# Patient Record
Sex: Female | Born: 1955 | Race: White | Hispanic: No | Marital: Married | Smoking: Never smoker
Health system: Southern US, Community
[De-identification: ages and names within clinical notes are randomized; demographics above are authoritative.]

## PROBLEM LIST (undated history)

## (undated) DIAGNOSIS — M1990 Unspecified osteoarthritis, unspecified site: Secondary | ICD-10-CM

## (undated) DIAGNOSIS — T7840XA Allergy, unspecified, initial encounter: Secondary | ICD-10-CM

## (undated) DIAGNOSIS — J309 Allergic rhinitis, unspecified: Secondary | ICD-10-CM

## (undated) DIAGNOSIS — H269 Unspecified cataract: Secondary | ICD-10-CM

## (undated) DIAGNOSIS — M8580 Other specified disorders of bone density and structure, unspecified site: Secondary | ICD-10-CM

## (undated) HISTORY — DX: Unspecified osteoarthritis, unspecified site: M19.90

## (undated) HISTORY — DX: Allergic rhinitis, unspecified: J30.9

## (undated) HISTORY — DX: Allergy, unspecified, initial encounter: T78.40XA

## (undated) HISTORY — DX: Other specified disorders of bone density and structure, unspecified site: M85.80

## (undated) HISTORY — DX: Unspecified cataract: H26.9

---

## 1979-10-11 HISTORY — PX: OTHER SURGICAL HISTORY: SHX169

## 2008-10-10 LAB — HM COLONOSCOPY: HM Colonoscopy: NORMAL

## 2009-05-29 ENCOUNTER — Ambulatory Visit: Payer: Self-pay | Admitting: Gastroenterology

## 2009-06-12 ENCOUNTER — Ambulatory Visit: Payer: Self-pay | Admitting: Gastroenterology

## 2009-06-12 HISTORY — PX: COLONOSCOPY: SHX174

## 2009-07-10 DIAGNOSIS — M8580 Other specified disorders of bone density and structure, unspecified site: Secondary | ICD-10-CM

## 2009-07-10 HISTORY — DX: Other specified disorders of bone density and structure, unspecified site: M85.80

## 2010-07-23 ENCOUNTER — Emergency Department (HOSPITAL_COMMUNITY): Admission: EM | Admit: 2010-07-23 | Discharge: 2010-07-24 | Payer: Self-pay | Admitting: Emergency Medicine

## 2012-01-16 ENCOUNTER — Other Ambulatory Visit (INDEPENDENT_AMBULATORY_CARE_PROVIDER_SITE_OTHER): Payer: BC Managed Care – PPO

## 2012-01-16 ENCOUNTER — Encounter: Payer: Self-pay | Admitting: Internal Medicine

## 2012-01-16 ENCOUNTER — Ambulatory Visit (INDEPENDENT_AMBULATORY_CARE_PROVIDER_SITE_OTHER): Payer: BC Managed Care – PPO | Admitting: Internal Medicine

## 2012-01-16 VITALS — BP 102/64 | HR 52 | Temp 97.0°F | Ht 67.0 in | Wt 136.6 lb

## 2012-01-16 DIAGNOSIS — Z0000 Encounter for general adult medical examination without abnormal findings: Secondary | ICD-10-CM

## 2012-01-16 DIAGNOSIS — M19042 Primary osteoarthritis, left hand: Secondary | ICD-10-CM | POA: Insufficient documentation

## 2012-01-16 DIAGNOSIS — M19041 Primary osteoarthritis, right hand: Secondary | ICD-10-CM | POA: Insufficient documentation

## 2012-01-16 DIAGNOSIS — Z23 Encounter for immunization: Secondary | ICD-10-CM

## 2012-01-16 DIAGNOSIS — J309 Allergic rhinitis, unspecified: Secondary | ICD-10-CM | POA: Insufficient documentation

## 2012-01-16 DIAGNOSIS — M8580 Other specified disorders of bone density and structure, unspecified site: Secondary | ICD-10-CM | POA: Insufficient documentation

## 2012-01-16 LAB — TSH: TSH: 0.78 u[IU]/mL (ref 0.35–5.50)

## 2012-01-16 LAB — LIPID PANEL
HDL: 82.5 mg/dL (ref >39.00)
Triglycerides: 71 mg/dL (ref 0.0–149.0)
VLDL: 14.2 mg/dL (ref 0.0–40.0)

## 2012-01-16 LAB — BASIC METABOLIC PANEL
Chloride: 101 mEq/L (ref 96–112)
GFR: 88.67 mL/min (ref >60.00)
Potassium: 4.2 mEq/L (ref 3.5–5.1)
Sodium: 138 mEq/L (ref 135–145)

## 2012-01-16 LAB — CBC WITH DIFFERENTIAL/PLATELET
Basophils Relative: 1.2 % (ref 0.0–3.0)
Eosinophils Relative: 1.6 % (ref 0.0–5.0)
HCT: 39.8 % (ref 36.0–46.0)
Hemoglobin: 13.5 g/dL (ref 12.0–15.0)
Lymphs Abs: 1.9 10*3/uL (ref 0.7–4.0)
MCV: 88.6 fl (ref 78.0–100.0)
Monocytes Relative: 8.5 % (ref 3.0–12.0)
Platelets: 258 10*3/uL (ref 150.0–400.0)
RBC: 4.49 Mil/uL (ref 3.87–5.11)
WBC: 5.2 10*3/uL (ref 4.5–10.5)

## 2012-01-16 LAB — HEPATIC FUNCTION PANEL
ALT: 17 U/L (ref 0–35)
Albumin: 4.6 g/dL (ref 3.5–5.2)
Total Protein: 7.9 g/dL (ref 6.0–8.3)

## 2012-01-16 LAB — URINALYSIS
Bilirubin Urine: NEGATIVE
Nitrite: NEGATIVE
Total Protein, Urine: NEGATIVE
Urine Glucose: NEGATIVE
pH: 6.5 (ref 5.0–8.0)

## 2012-01-16 LAB — LDL CHOLESTEROL, DIRECT: Direct LDL: 156.4 mg/dL

## 2012-01-16 NOTE — Patient Instructions (Signed)
It was good to see you today. We have reviewed your prior records including labs and tests today Medications reviewed, no changes at this time.  Health Maintenance reviewed - Tdap booster done today. All other immunizations and age based screening are up-to-date. Test(s) ordered today. Your results will be called to you and mailed to you after review (48-72hours after test completion). If any changes need to be made, you will be notified at that time. Please schedule followup annually for physical and labs, call sooner if problems.

## 2012-01-16 NOTE — Progress Notes (Signed)
  Subjective:    Patient ID: Brianna Gardner, female    DOB: 1955-10-23, 55 y.o.   MRN: [id number]  HPI  New patient to me and our division, here today to establish care also patient is here today for annual physical. Patient feels well and has no complaints.   Past Medical History  Diagnosis Date  . Osteoarthritis     hands, great toes B - follows with rheum  . Allergic rhinitis, cause unspecified   . Osteopenia 07/2009    DEXA 08/06/09: -1.9 fem   Family History  Problem Relation Age of Onset  . Arthritis Mother   . Arthritis Father   . Stroke Father   . Stroke Maternal Grandfather   . Stroke Paternal Grandfather    History  Substance Use Topics  . Smoking status: Never Smoker   . Smokeless tobacco: Not on file  . Alcohol Use: Yes    Review of Systems Constitutional: Negative for fever or weight change.  Respiratory: Negative for cough and shortness of breath.   Cardiovascular: Negative for chest pain or palpitations.  Gastrointestinal: Negative for abdominal pain, no bowel changes.  Musculoskeletal: Negative for gait problem or joint swelling.  Skin: Negative for rash.  Neurological: Negative for dizziness or headache.  No other specific complaints in a complete review of systems (except as listed in HPI above).     Objective:   Physical Exam BP 102/64  Pulse 52  Temp(Src) 97 F (36.1 C) (Oral)  Ht 5\' 7"  (1.702 m)  Wt 136 lb 9.6 oz (61.961 kg)  BMI 21.39 kg/m2  SpO2 98% Wt Readings from Last 3 Encounters:  01/16/12 136 lb 9.6 oz (61.961 kg)   Constitutional: She appears well-developed and well-nourished. No distress.  HENT: Head: Normocephalic and atraumatic. Ears: B TMs ok, no erythema or effusion; Nose: Nose normal. Mouth/Throat: Oropharynx is clear and moist. No oropharyngeal exudate.  Eyes: Conjunctivae and EOM are normal. Pupils are equal, round, and reactive to light. No scleral icterus.  Neck: Normal range of motion. Neck supple. No JVD present.  No thyromegaly present.  Cardiovascular: Normal rate, regular rhythm and normal heart sounds.  No murmur heard. No BLE edema. Pulmonary/Chest: Effort normal and breath sounds normal. No respiratory distress. She has no wheezes.  Abdominal: Soft. Bowel sounds are normal. She exhibits no distension. There is no tenderness. no masses Musculoskeletal: Normal range of motion, no joint effusions. No gross deformities Neurological: She is alert and oriented to person, place, and time. No cranial nerve deficit. Coordination normal.  Skin: Skin is warm and dry. No rash noted. No erythema.  Psychiatric: She has a normal mood and affect. Her behavior is normal. Judgment and thought content normal.   No results found for this basename: WBC,  HGB,  HCT,  PLT,  GLUCOSE,  CHOL,  TRIG,  HDL,  LDLDIRECT,  LDLCALC,  ALT,  AST,  NA,  K,  CL,  CREATININE,  BUN,  CO2,  TSH,  PSA,  INR,  GLUF,  HGBA1C,  MICROALBUR       Assessment & Plan:  CPX/v70.0 - Patient has been counseled on age-appropriate routine health concerns for screening and prevention. These are reviewed and up-to-date. Immunizations are up-to-date or declined. Labs ordered and will be reviewed.

## 2012-01-17 ENCOUNTER — Telehealth: Payer: Self-pay | Admitting: *Deleted

## 2012-01-17 NOTE — Telephone Encounter (Signed)
Miss and exit out on labs encounter. Called pt LMOM RTC concerning labs.... 01/17/12@4 :00pm/LMB

## 2012-01-17 NOTE — Telephone Encounter (Signed)
Pt return call back gave results & recommendations concerning labs. Also mail copy to pt address...01/17/12@4 :27pm/LMB

## 2012-08-21 LAB — HM PAP SMEAR

## 2013-01-16 ENCOUNTER — Other Ambulatory Visit (INDEPENDENT_AMBULATORY_CARE_PROVIDER_SITE_OTHER): Payer: BC Managed Care – PPO

## 2013-01-16 ENCOUNTER — Telehealth: Payer: Self-pay | Admitting: *Deleted

## 2013-01-16 DIAGNOSIS — Z0000 Encounter for general adult medical examination without abnormal findings: Secondary | ICD-10-CM

## 2013-01-16 LAB — CBC WITH DIFFERENTIAL/PLATELET
Basophils Absolute: 0 10*3/uL (ref 0.0–0.1)
Lymphocytes Relative: 36.8 % (ref 12.0–46.0)
Monocytes Relative: 10.3 % (ref 3.0–12.0)
Platelets: 236 10*3/uL (ref 150.0–400.0)
RDW: 13.3 % (ref 11.5–14.6)

## 2013-01-16 LAB — URINALYSIS, ROUTINE W REFLEX MICROSCOPIC
Leukocytes, UA: NEGATIVE
Specific Gravity, Urine: <=1.005 (ref 1.000–1.030)
Urobilinogen, UA: 0.2 (ref 0.0–1.0)

## 2013-01-16 LAB — BASIC METABOLIC PANEL
BUN: 17 mg/dL (ref 6–23)
Calcium: 8.9 mg/dL (ref 8.4–10.5)
GFR: 85.61 mL/min (ref >60.00)
Glucose, Bld: 89 mg/dL (ref 70–99)
Sodium: 134 mEq/L — ABNORMAL LOW (ref 135–145)

## 2013-01-16 LAB — HEPATIC FUNCTION PANEL
Bilirubin, Direct: 0.1 mg/dL (ref 0.0–0.3)
Total Bilirubin: 0.8 mg/dL (ref 0.3–1.2)
Total Protein: 7.4 g/dL (ref 6.0–8.3)

## 2013-01-16 LAB — LIPID PANEL
HDL: 73.2 mg/dL (ref >39.00)
Triglycerides: 49 mg/dL (ref 0.0–149.0)
VLDL: 9.8 mg/dL (ref 0.0–40.0)

## 2013-01-16 LAB — TSH: TSH: 0.78 u[IU]/mL (ref 0.35–5.50)

## 2013-01-16 NOTE — Telephone Encounter (Signed)
Pt is here for cpx labs. appt schedule for 01/28/13. Entering cpx labs in comp...Raechel Chute

## 2013-01-28 ENCOUNTER — Ambulatory Visit (INDEPENDENT_AMBULATORY_CARE_PROVIDER_SITE_OTHER): Payer: BC Managed Care – PPO | Admitting: Internal Medicine

## 2013-01-28 ENCOUNTER — Encounter: Payer: Self-pay | Admitting: Internal Medicine

## 2013-01-28 VITALS — BP 104/60 | HR 64 | Temp 98.7°F | Ht 67.0 in | Wt 134.1 lb

## 2013-01-28 DIAGNOSIS — Z0000 Encounter for general adult medical examination without abnormal findings: Secondary | ICD-10-CM

## 2013-01-28 NOTE — Patient Instructions (Signed)
It was good to see you today. We have reviewed your prior records including labs and tests today Medications reviewed, no changes at this time.  Health Maintenance reviewed - all immunizations and age based screening are up-to-date. Please schedule followup annually for physical and labs, call sooner if problems.  Health Maintenance, Females A healthy lifestyle and preventative care can promote health and wellness.  Maintain regular health, dental, and eye exams.  Eat a healthy diet. Foods like vegetables, fruits, whole grains, low-fat dairy products, and lean protein foods contain the nutrients you need without too many calories. Decrease your intake of foods high in solid fats, added sugars, and salt. Get information about a proper diet from your caregiver, if necessary.  Regular physical exercise is one of the most important things you can do for your health. Most adults should get at least 150 minutes of moderate-intensity exercise (any activity that increases your heart rate and causes you to sweat) each week. In addition, most adults need muscle-strengthening exercises on 2 or more days a week.   Maintain a healthy weight. The body mass index (BMI) is a screening tool to identify possible weight problems. It provides an estimate of body fat based on height and weight. Your caregiver can help determine your BMI, and can help you achieve or maintain a healthy weight. For adults 20 years and older:  A BMI below 18.5 is considered underweight.  A BMI of 18.5 to 24.9 is normal.  A BMI of 25 to 29.9 is considered overweight.  A BMI of 30 and above is considered obese.  Maintain normal blood lipids and cholesterol by exercising and minimizing your intake of saturated fat. Eat a balanced diet with plenty of fruits and vegetables. Blood tests for lipids and cholesterol should begin at age 66 and be repeated every 5 years. If your lipid or cholesterol levels are high, you are over 50, or you  are a high risk for heart disease, you may need your cholesterol levels checked more frequently.Ongoing high lipid and cholesterol levels should be treated with medicines if diet and exercise are not effective.  If you smoke, find out from your caregiver how to quit. If you do not use tobacco, do not start.  If you are pregnant, do not drink alcohol. If you are breastfeeding, be very cautious about drinking alcohol. If you are not pregnant and choose to drink alcohol, do not exceed 1 drink per day. One drink is considered to be 12 ounces (355 mL) of beer, 5 ounces (148 mL) of wine, or 1.5 ounces (44 mL) of liquor.  Avoid use of street drugs. Do not share needles with anyone. Ask for help if you need support or instructions about stopping the use of drugs.  High blood pressure causes heart disease and increases the risk of stroke. Blood pressure should be checked at least every 1 to 2 years. Ongoing high blood pressure should be treated with medicines, if weight loss and exercise are not effective.  If you are 59 to 58 years old, ask your caregiver if you should take aspirin to prevent strokes.  Diabetes screening involves taking a blood sample to check your fasting blood sugar level. This should be done once every 3 years, after age 69, if you are within normal weight and without risk factors for diabetes. Testing should be considered at a younger age or be carried out more frequently if you are overweight and have at least 1 risk factor for diabetes.  Breast cancer screening is essential preventative care for women. You should practice "breast self-awareness." This means understanding the normal appearance and feel of your breasts and may include breast self-examination. Any changes detected, no matter how small, should be reported to a caregiver. Women in their 8s and 30s should have a clinical breast exam (CBE) by a caregiver as part of a regular health exam every 1 to 3 years. After age 12, women  should have a CBE every year. Starting at age 90, women should consider having a mammogram (breast X-ray) every year. Women who have a family history of breast cancer should talk to their caregiver about genetic screening. Women at a high risk of breast cancer should talk to their caregiver about having an MRI and a mammogram every year.  The Pap test is a screening test for cervical cancer. Women should have a Pap test starting at age 42. Between ages 28 and 31, Pap tests should be repeated every 2 years. Beginning at age 36, you should have a Pap test every 3 years as long as the past 3 Pap tests have been normal. If you had a hysterectomy for a problem that was not cancer or a condition that could lead to cancer, then you no longer need Pap tests. If you are between ages 34 and 72, and you have had normal Pap tests going back 10 years, you no longer need Pap tests. If you have had past treatment for cervical cancer or a condition that could lead to cancer, you need Pap tests and screening for cancer for at least 20 years after your treatment. If Pap tests have been discontinued, risk factors (such as a new sexual partner) need to be reassessed to determine if screening should be resumed. Some women have medical problems that increase the chance of getting cervical cancer. In these cases, your caregiver may recommend more frequent screening and Pap tests.  The human papillomavirus (HPV) test is an additional test that may be used for cervical cancer screening. The HPV test looks for the virus that can cause the cell changes on the cervix. The cells collected during the Pap test can be tested for HPV. The HPV test could be used to screen women aged 23 years and older, and should be used in women of any age who have unclear Pap test results. After the age of 56, women should have HPV testing at the same frequency as a Pap test.  Colorectal cancer can be detected and often prevented. Most routine colorectal  cancer screening begins at the age of 41 and continues through age 11. However, your caregiver may recommend screening at an earlier age if you have risk factors for colon cancer. On a yearly basis, your caregiver may provide home test kits to check for hidden blood in the stool. Use of a small camera at the end of a tube, to directly examine the colon (sigmoidoscopy or colonoscopy), can detect the earliest forms of colorectal cancer. Talk to your caregiver about this at age 68, when routine screening begins. Direct examination of the colon should be repeated every 5 to 10 years through age 33, unless early forms of pre-cancerous polyps or small growths are found.  Hepatitis C blood testing is recommended for all people born from [birth year] through 1966 and any individual with known risks for hepatitis C.  Practice safe sex. Use condoms and avoid high-risk sexual practices to reduce the spread of sexually transmitted infections (STIs). Sexually active women aged  25 and younger should be checked for Chlamydia, which is a common sexually transmitted infection. Older women with new or multiple partners should also be tested for Chlamydia. Testing for other STIs is recommended if you are sexually active and at increased risk.  Osteoporosis is a disease in which the bones lose minerals and strength with aging. This can result in serious bone fractures. The risk of osteoporosis can be identified using a bone density scan. Women ages 45 and over and women at risk for fractures or osteoporosis should discuss screening with their caregivers. Ask your caregiver whether you should be taking a calcium supplement or vitamin D to reduce the rate of osteoporosis.  Menopause can be associated with physical symptoms and risks. Hormone replacement therapy is available to decrease symptoms and risks. You should talk to your caregiver about whether hormone replacement therapy is right for you.  Use sunscreen with a sun protection  factor (SPF) of 30 or greater. Apply sunscreen liberally and repeatedly throughout the day. You should seek shade when your shadow is shorter than you. Protect yourself by wearing long sleeves, pants, a wide-brimmed hat, and sunglasses year round, whenever you are outdoors.  Notify your caregiver of new moles or changes in moles, especially if there is a change in shape or color. Also notify your caregiver if a mole is larger than the size of a pencil eraser.  Stay current with your immunizations. Document Released: 04/11/2011 Document Revised: 12/19/2011 Document Reviewed: 04/11/2011 [facility] Patient Information 2013 [place], [place].

## 2013-01-28 NOTE — Progress Notes (Signed)
Subjective:    Patient ID: Brianna Gardner, female    DOB: Oct 17, 1955, 57 y.o.   MRN: [id number]  HPI  patient is here today for annual physical. Patient feels well overall  Past Medical History  Diagnosis Date  . Osteoarthritis     hands, great toes B - follows with rheum  . Allergic rhinitis, cause unspecified   . Osteopenia 07/2009    DEXA 08/06/09: -1.9 fem   Family History  Problem Relation Age of Onset  . Arthritis Mother   . Arthritis Father   . Stroke Father   . Stroke Maternal Grandfather   . Stroke Paternal Grandfather    History  Substance Use Topics  . Smoking status: Never Smoker   . Smokeless tobacco: Not on file  . Alcohol Use: Yes    Review of Systems  Constitutional: Negative for fever or weight change.  Respiratory: Negative for cough and shortness of breath.   Cardiovascular: Negative for chest pain or palpitations.  Gastrointestinal: Negative for abdominal pain, no bowel changes.  Musculoskeletal: Negative for gait problem or joint swelling.  Skin: Negative for rash.  Neurological: Negative for dizziness or headache.  No other specific complaints in a complete review of systems (except as listed in HPI above).     Objective:   Physical Exam  BP 104/60  Pulse 64  Temp(Src) 98.7 F (37.1 C) (Oral)  Ht 5\' 7"  (1.702 m)  Wt 134 lb 1.9 oz (60.836 kg)  BMI 21 kg/m2  SpO2 98% Wt Readings from Last 3 Encounters:  01/28/13 134 lb 1.9 oz (60.836 kg)  01/16/12 136 lb 9.6 oz (61.961 kg)   Constitutional: She appears well-developed and well-nourished. No distress.  HENT: Head: Normocephalic and atraumatic. Ears: B TMs ok, no erythema or effusion; Nose: Nose normal. Mouth/Throat: Oropharynx is clear and moist. No oropharyngeal exudate.  Eyes: Conjunctivae and EOM are normal. Pupils are equal, round, and reactive to light. No scleral icterus.  Neck: Normal range of motion. Neck supple. No JVD present. No thyromegaly present.  Cardiovascular: Normal  rate, regular rhythm and normal heart sounds.  No murmur heard. No BLE edema. Pulmonary/Chest: Effort normal and breath sounds normal. No respiratory distress. She has no wheezes.  Abdominal: Soft. Bowel sounds are normal. She exhibits no distension. There is no tenderness. no masses Musculoskeletal: Normal range of motion, no joint effusions. No gross deformities Neurological: She is alert and oriented to person, place, and time. No cranial nerve deficit. Coordination normal.  Skin: Skin is warm and dry. No rash noted. No erythema.  Psychiatric: She has a normal mood and affect. Her behavior is normal. Judgment and thought content normal.   Lab Results  Component Value Date   WBC 4.4* 01/16/2013   HGB 12.7 01/16/2013   HCT 36.9 01/16/2013   PLT 236.0 01/16/2013   GLUCOSE 89 01/16/2013   CHOL 242* 01/16/2013   TRIG 49.0 01/16/2013   HDL 73.20 01/16/2013   LDLDIRECT 149.4 01/16/2013   ALT 20 01/16/2013   AST 24 01/16/2013   NA 134* 01/16/2013   K 3.9 01/16/2013   CL 98 01/16/2013   CREATININE 0.7 01/16/2013   BUN 17 01/16/2013   CO2 25 01/16/2013   TSH 0.78 01/16/2013   Ecg: RBBB - sinus @ 62bpm -no prior comparison     Assessment & Plan:  CPX/v70.0 - Patient has been counseled on age-appropriate routine health concerns for screening and prevention. These are reviewed and up-to-date. Immunizations are up-to-date or declined. Labs/ECG ordered  and reviewed.

## 2013-07-22 ENCOUNTER — Encounter: Payer: Self-pay | Admitting: Internal Medicine

## 2013-10-14 LAB — HM MAMMOGRAPHY

## 2013-10-15 ENCOUNTER — Encounter: Payer: Self-pay | Admitting: Internal Medicine

## 2014-03-27 ENCOUNTER — Encounter: Payer: BC Managed Care – PPO | Admitting: Internal Medicine

## 2014-05-26 ENCOUNTER — Encounter: Payer: Self-pay | Admitting: Gastroenterology

## 2014-06-24 ENCOUNTER — Encounter: Payer: Self-pay | Admitting: Internal Medicine

## 2014-06-24 ENCOUNTER — Other Ambulatory Visit (INDEPENDENT_AMBULATORY_CARE_PROVIDER_SITE_OTHER): Payer: BC Managed Care – PPO

## 2014-06-24 ENCOUNTER — Ambulatory Visit (INDEPENDENT_AMBULATORY_CARE_PROVIDER_SITE_OTHER): Payer: BC Managed Care – PPO | Admitting: Internal Medicine

## 2014-06-24 VITALS — BP 108/78 | HR 65 | Temp 97.6°F | Ht 67.0 in | Wt 135.5 lb

## 2014-06-24 DIAGNOSIS — Z0000 Encounter for general adult medical examination without abnormal findings: Secondary | ICD-10-CM

## 2014-06-24 LAB — URINALYSIS, ROUTINE W REFLEX MICROSCOPIC
BILIRUBIN URINE: NEGATIVE
Hgb urine dipstick: NEGATIVE
KETONES UR: NEGATIVE
Leukocytes, UA: NEGATIVE
Nitrite: NEGATIVE
PH: 7.5 (ref 5.0–8.0)
RBC / HPF: NONE SEEN (ref 0–?)
Specific Gravity, Urine: <=1.005 — AB (ref 1.000–1.030)
TOTAL PROTEIN, URINE-UPE24: NEGATIVE
Urine Glucose: NEGATIVE
Urobilinogen, UA: 0.2 (ref 0.0–1.0)
WBC, UA: NONE SEEN (ref 0–?)

## 2014-06-24 LAB — LIPID PANEL
Cholesterol: 243 mg/dL — ABNORMAL HIGH (ref 0–200)
HDL: 79.4 mg/dL (ref >39.00)
LDL Cholesterol: 152 mg/dL — ABNORMAL HIGH (ref 0–99)
NonHDL: 163.6
TRIGLYCERIDES: 59 mg/dL (ref 0.0–149.0)
Total CHOL/HDL Ratio: 3
VLDL: 11.8 mg/dL (ref 0.0–40.0)

## 2014-06-24 LAB — BASIC METABOLIC PANEL
BUN: 14 mg/dL (ref 6–23)
CALCIUM: 9.6 mg/dL (ref 8.4–10.5)
CO2: 29 meq/L (ref 19–32)
Chloride: 101 mEq/L (ref 96–112)
Creatinine, Ser: 0.8 mg/dL (ref 0.4–1.2)
GFR: 76.75 mL/min (ref >60.00)
Glucose, Bld: 89 mg/dL (ref 70–99)
POTASSIUM: 4.4 meq/L (ref 3.5–5.1)
SODIUM: 137 meq/L (ref 135–145)

## 2014-06-24 LAB — TSH: TSH: 1.03 u[IU]/mL (ref 0.35–4.50)

## 2014-06-24 LAB — HEPATIC FUNCTION PANEL
ALT: 20 U/L (ref 0–35)
AST: 24 U/L (ref 0–37)
Albumin: 4.2 g/dL (ref 3.5–5.2)
Alkaline Phosphatase: 73 U/L (ref 39–117)
BILIRUBIN TOTAL: 0.7 mg/dL (ref 0.2–1.2)
Bilirubin, Direct: 0.1 mg/dL (ref 0.0–0.3)
Total Protein: 7.8 g/dL (ref 6.0–8.3)

## 2014-06-24 NOTE — Progress Notes (Signed)
Pre visit review using our clinic review tool, if applicable. No additional management support is needed unless otherwise documented below in the visit note. 

## 2014-06-24 NOTE — Patient Instructions (Addendum)
It was good to see you today.  We have reviewed your prior records including labs and tests today  Health Maintenance reviewed - get your flu shot in next 6-8 weeks -all other recommended immunizations and age-appropriate screenings are up-to-date.  Test(s) ordered today. Your results will be released to [facility] (or called to you) after review, usually within 72hours after test completion. If any changes need to be made, you will be notified at that same time.  Medications reviewed and updated, no changes recommended at this time.  Please schedule followup in 12-18 months for annual exam and labs, call sooner if problems.  Health Maintenance Adopting a healthy lifestyle and getting preventive care can go a long way to promote health and wellness. Talk with your health care provider about what schedule of regular examinations is right for you. This is a good chance for you to check in with your provider about disease prevention and staying healthy. In between checkups, there are plenty of things you can do on your own. Experts have done a lot of research about which lifestyle changes and preventive measures are most likely to keep you healthy. Ask your health care provider for more information. WEIGHT AND DIET  Eat a healthy diet  Be sure to include plenty of vegetables, fruits, low-fat dairy products, and lean protein.  Do not eat a lot of foods high in solid fats, added sugars, or salt.  Get regular exercise. This is one of the most important things you can do for your health.  Most adults should exercise for at least 150 minutes each week. The exercise should increase your heart rate and make you sweat (moderate-intensity exercise).  Most adults should also do strengthening exercises at least twice a week. This is in addition to the moderate-intensity exercise.  Maintain a healthy weight  Body mass index (BMI) is a measurement that can be used to identify possible weight problems. It  estimates body fat based on height and weight. Your health care provider can help determine your BMI and help you achieve or maintain a healthy weight.  For females 59 years of age and older:   A BMI below 18.5 is considered underweight.  A BMI of 18.5 to 24.9 is normal.  A BMI of 25 to 29.9 is considered overweight.  A BMI of 30 and above is considered obese.  Watch levels of cholesterol and blood lipids  You should start having your blood tested for lipids and cholesterol at 59 years of age, then have this test every 5 years.  You may need to have your cholesterol levels checked more often if:  Your lipid or cholesterol levels are high.  You are older than 59 years of age.  You are at high risk for heart disease.  CANCER SCREENING   Lung Cancer  Lung cancer screening is recommended for adults 80-47 years old who are at high risk for lung cancer because of a history of smoking.  A yearly low-dose CT scan of the lungs is recommended for people who:  Currently smoke.  Have quit within the past 15 years.  Have at least a 30-pack-year history of smoking. A pack year is smoking an average of one pack of cigarettes a day for 1 year.  Yearly screening should continue until it has been 15 years since you quit.  Yearly screening should stop if you develop a health problem that would prevent you from having lung cancer treatment.  Breast Cancer  Practice breast self-awareness.  This means understanding how your breasts normally appear and feel.  It also means doing regular breast self-exams. Let your health care provider know about any changes, no matter how small.  If you are in your 20s or 30s, you should have a clinical breast exam (CBE) by a health care provider every 1-3 years as part of a regular health exam.  If you are 79 or older, have a CBE every year. Also consider having a breast X-ray (mammogram) every year.  If you have a family history of breast cancer, talk  to your health care provider about genetic screening.  If you are at high risk for breast cancer, talk to your health care provider about having an MRI and a mammogram every year.  Breast cancer gene (BRCA) assessment is recommended for women who have family members with BRCA-related cancers. BRCA-related cancers include:  Breast.  Ovarian.  Tubal.  Peritoneal cancers.  Results of the assessment will determine the need for genetic counseling and BRCA1 and BRCA2 testing. Cervical Cancer Routine pelvic examinations to screen for cervical cancer are no longer recommended for nonpregnant women who are considered low risk for cancer of the pelvic organs (ovaries, uterus, and vagina) and who do not have symptoms. A pelvic examination may be necessary if you have symptoms including those associated with pelvic infections. Ask your health care provider if a screening pelvic exam is right for you.   The Pap test is the screening test for cervical cancer for women who are considered at risk.  If you had a hysterectomy for a problem that was not cancer or a condition that could lead to cancer, then you no longer need Pap tests.  If you are older than 65 years, and you have had normal Pap tests for the past 10 years, you no longer need to have Pap tests.  If you have had past treatment for cervical cancer or a condition that could lead to cancer, you need Pap tests and screening for cancer for at least 20 years after your treatment.  If you no longer get a Pap test, assess your risk factors if they change (such as having a new sexual partner). This can affect whether you should start being screened again.  Some women have medical problems that increase their chance of getting cervical cancer. If this is the case for you, your health care provider may recommend more frequent screening and Pap tests.  The human papillomavirus (HPV) test is another test that may be used for cervical cancer screening.  The HPV test looks for the virus that can cause cell changes in the cervix. The cells collected during the Pap test can be tested for HPV.  The HPV test can be used to screen women 35 years of age and older. Getting tested for HPV can extend the interval between normal Pap tests from three to five years.  An HPV test also should be used to screen women of any age who have unclear Pap test results.  After 59 years of age, women should have HPV testing as often as Pap tests.  Colorectal Cancer  This type of cancer can be detected and often prevented.  Routine colorectal cancer screening usually begins at 59 years of age and continues through 59 years of age.  Your health care provider may recommend screening at an earlier age if you have risk factors for colon cancer.  Your health care provider may also recommend using home test kits to check for  hidden blood in the stool.  A small camera at the end of a tube can be used to examine your colon directly (sigmoidoscopy or colonoscopy). This is done to check for the earliest forms of colorectal cancer.  Routine screening usually begins at age 50.  Direct examination of the colon should be repeated every 5-10 years through 59 years of age. However, you may need to be screened more often if early forms of precancerous polyps or small growths are found. Skin Cancer  Check your skin from head to toe regularly.  Tell your health care provider about any new moles or changes in moles, especially if there is a change in a mole's shape or color.  Also tell your health care provider if you have a mole that is larger than the size of a pencil eraser.  Always use sunscreen. Apply sunscreen liberally and repeatedly throughout the day.  Protect yourself by wearing long sleeves, pants, a wide-brimmed hat, and sunglasses whenever you are outside. HEART DISEASE, DIABETES, AND HIGH BLOOD PRESSURE   Have your blood pressure checked at least every 1-2  years. High blood pressure causes heart disease and increases the risk of stroke.  If you are between 13 years and 83 years old, ask your health care provider if you should take aspirin to prevent strokes.  Have regular diabetes screenings. This involves taking a blood sample to check your fasting blood sugar level.  If you are at a normal weight and have a low risk for diabetes, have this test once every three years after 59 years of age.  If you are overweight and have a high risk for diabetes, consider being tested at a younger age or more often. PREVENTING INFECTION  Hepatitis B  If you have a higher risk for hepatitis B, you should be screened for this virus. You are considered at high risk for hepatitis B if:  You were born in a country where hepatitis B is common. Ask your health care provider which countries are considered high risk.  Your parents were born in a high-risk country, and you have not been immunized against hepatitis B (hepatitis B vaccine).  You have HIV or AIDS.  You use needles to inject street drugs.  You live with someone who has hepatitis B.  You have had sex with someone who has hepatitis B.  You get hemodialysis treatment.  You take certain medicines for conditions, including cancer, organ transplantation, and autoimmune conditions. Hepatitis C  Blood testing is recommended for:  Everyone born from [birth year] through 1966.  Anyone with known risk factors for hepatitis C. Sexually transmitted infections (STIs)  You should be screened for sexually transmitted infections (STIs) including gonorrhea and chlamydia if:  You are sexually active and are younger than 59 years of age.  You are older than 59 years of age and your health care provider tells you that you are at risk for this type of infection.  Your sexual activity has changed since you were last screened and you are at an increased risk for chlamydia or gonorrhea. Ask your health care provider if  you are at risk.  If you do not have HIV, but are at risk, it may be recommended that you take a prescription medicine daily to prevent HIV infection. This is called pre-exposure prophylaxis (PrEP). You are considered at risk if:  You are sexually active and do not regularly use condoms or know the HIV status of your partner(s).  You take drugs by  injection.  You are sexually active with a partner who has HIV. Talk with your health care provider about whether you are at high risk of being infected with HIV. If you choose to begin PrEP, you should first be tested for HIV. You should then be tested every 3 months for as long as you are taking PrEP.  PREGNANCY   If you are premenopausal and you may become pregnant, ask your health care provider about preconception counseling.  If you may become pregnant, take 400 to 800 micrograms (mcg) of folic acid every day.  If you want to prevent pregnancy, talk to your health care provider about birth control (contraception). OSTEOPOROSIS AND MENOPAUSE   Osteoporosis is a disease in which the bones lose minerals and strength with aging. This can result in serious bone fractures. Your risk for osteoporosis can be identified using a bone density scan.  If you are 5 years of age or older, or if you are at risk for osteoporosis and fractures, ask your health care provider if you should be screened.  Ask your health care provider whether you should take a calcium or vitamin D supplement to lower your risk for osteoporosis.  Menopause may have certain physical symptoms and risks.  Hormone replacement therapy may reduce some of these symptoms and risks. Talk to your health care provider about whether hormone replacement therapy is right for you.  HOME CARE INSTRUCTIONS   Schedule regular health, dental, and eye exams.  Stay current with your immunizations.   Do not use any tobacco products including cigarettes, chewing tobacco, or electronic  cigarettes.  If you are pregnant, do not drink alcohol.  If you are breastfeeding, limit how much and how often you drink alcohol.  Limit alcohol intake to no more than 1 drink per day for nonpregnant women. One drink equals 12 ounces of beer, 5 ounces of wine, or 1 ounces of hard liquor.  Do not use street drugs.  Do not share needles.  Ask your health care provider for help if you need support or information about quitting drugs.  Tell your health care provider if you often feel depressed.  Tell your health care provider if you have ever been abused or do not feel safe at home. Document Released: 04/11/2011 Document Revised: 02/10/2014 Document Reviewed: 08/28/2013 [facility] Patient Information 2015 [place], [place]. This information is not intended to replace advice given to you by your health care provider. Make sure you discuss any questions you have with your health care provider.
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
## 2014-06-25 LAB — CBC WITH DIFFERENTIAL/PLATELET
BASOS ABS: 0.1 10*3/uL (ref 0.0–0.1)
Basophils Relative: 1.5 % (ref 0.0–3.0)
Eosinophils Absolute: 0.1 10*3/uL (ref 0.0–0.7)
Eosinophils Relative: 3.4 % (ref 0.0–5.0)
HCT: 40.4 % (ref 36.0–46.0)
Hemoglobin: 14 g/dL (ref 12.0–15.0)
LYMPHS ABS: 1.4 10*3/uL (ref 0.7–4.0)
Lymphocytes Relative: 32.7 % (ref 12.0–46.0)
MCHC: 34.7 g/dL (ref 30.0–36.0)
MCV: 88 fl (ref 78.0–100.0)
MONO ABS: 0.4 10*3/uL (ref 0.1–1.0)
MONOS PCT: 8 % (ref 3.0–12.0)
Neutro Abs: 2.4 10*3/uL (ref 1.4–7.7)
Neutrophils Relative %: 54.4 % (ref 43.0–77.0)
PLATELETS: 271 10*3/uL (ref 150.0–400.0)
RBC: 4.59 Mil/uL (ref 3.87–5.11)
RDW: 13.3 % (ref 11.5–15.5)
WBC: 4.4 10*3/uL (ref 4.0–10.5)

## 2015-12-18 ENCOUNTER — Encounter: Payer: Self-pay | Admitting: Internal Medicine

## 2015-12-18 ENCOUNTER — Other Ambulatory Visit (INDEPENDENT_AMBULATORY_CARE_PROVIDER_SITE_OTHER): Payer: BC Managed Care – PPO

## 2015-12-18 ENCOUNTER — Ambulatory Visit (INDEPENDENT_AMBULATORY_CARE_PROVIDER_SITE_OTHER): Payer: BC Managed Care – PPO | Admitting: Internal Medicine

## 2015-12-18 VITALS — BP 100/60 | HR 56 | Temp 98.0°F | Resp 14 | Ht 66.0 in | Wt 132.8 lb

## 2015-12-18 DIAGNOSIS — Z0000 Encounter for general adult medical examination without abnormal findings: Secondary | ICD-10-CM | POA: Diagnosis not present

## 2015-12-18 DIAGNOSIS — Z23 Encounter for immunization: Secondary | ICD-10-CM | POA: Diagnosis not present

## 2015-12-18 LAB — CBC
HCT: 39.9 % (ref 36.0–46.0)
HEMOGLOBIN: 13.5 g/dL (ref 12.0–15.0)
MCHC: 33.9 g/dL (ref 30.0–36.0)
MCV: 87.4 fl (ref 78.0–100.0)
PLATELETS: 251 10*3/uL (ref 150.0–400.0)
RBC: 4.57 Mil/uL (ref 3.87–5.11)
RDW: 12.7 % (ref 11.5–15.5)
WBC: 4.8 10*3/uL (ref 4.0–10.5)

## 2015-12-18 LAB — COMPREHENSIVE METABOLIC PANEL
ALK PHOS: 79 U/L (ref 39–117)
ALT: 20 U/L (ref 0–35)
AST: 25 U/L (ref 0–37)
Albumin: 4.5 g/dL (ref 3.5–5.2)
BILIRUBIN TOTAL: 0.5 mg/dL (ref 0.2–1.2)
BUN: 16 mg/dL (ref 6–23)
CALCIUM: 9.5 mg/dL (ref 8.4–10.5)
CO2: 29 meq/L (ref 19–32)
Chloride: 103 mEq/L (ref 96–112)
Creatinine, Ser: 0.79 mg/dL (ref 0.40–1.20)
GFR: 78.6 mL/min (ref >60.00)
GLUCOSE: 93 mg/dL (ref 70–99)
POTASSIUM: 4.3 meq/L (ref 3.5–5.1)
Sodium: 139 mEq/L (ref 135–145)
Total Protein: 8.1 g/dL (ref 6.0–8.3)

## 2015-12-18 LAB — LIPID PANEL
Cholesterol: 243 mg/dL — ABNORMAL HIGH (ref 0–200)
HDL: 75.3 mg/dL (ref >39.00)
LDL Cholesterol: 154 mg/dL — ABNORMAL HIGH (ref 0–99)
NonHDL: 167.43
TRIGLYCERIDES: 67 mg/dL (ref 0.0–149.0)
Total CHOL/HDL Ratio: 3
VLDL: 13.4 mg/dL (ref 0.0–40.0)

## 2015-12-18 NOTE — Patient Instructions (Signed)
We have given you the shingles shot today and will check the labs.   We will send the results on mychart.   Come back in about 1-2 years for a check up and please feel free to call us sooner if you have any problems or questions.   Health Maintenance, Female Adopting a healthy lifestyle and getting preventive care can go a long way to promote health and wellness. Talk with your health care provider about what schedule of regular examinations is right for you. This is a good chance for you to check in with your provider about disease prevention and staying healthy. In between checkups, there are plenty of things you can do on your own. Experts have done a lot of research about which lifestyle changes and preventive measures are most likely to keep you healthy. Ask your health care provider for more information. WEIGHT AND DIET  Eat a healthy diet  Be sure to include plenty of vegetables, fruits, low-fat dairy products, and lean protein.  Do not eat a lot of foods high in solid fats, added sugars, or salt.  Get regular exercise. This is one of the most important things you can do for your health.  Most adults should exercise for at least 150 minutes each week. The exercise should increase your heart rate and make you sweat (moderate-intensity exercise).  Most adults should also do strengthening exercises at least twice a week. This is in addition to the moderate-intensity exercise.  Maintain a healthy weight  Body mass index (BMI) is a measurement that can be used to identify possible weight problems. It estimates body fat based on height and weight. Your health care provider can help determine your BMI and help you achieve or maintain a healthy weight.  For females 75 years of age and older:   A BMI below 18.5 is considered underweight.  A BMI of 18.5 to 24.9 is normal.  A BMI of 25 to 29.9 is considered overweight.  A BMI of 30 and above is considered obese.  Watch levels of  cholesterol and blood lipids  You should start having your blood tested for lipids and cholesterol at 61 years of age, then have this test every 5 years.  You may need to have your cholesterol levels checked more often if:  Your lipid or cholesterol levels are high.  You are older than 61 years of age.  You are at high risk for heart disease.  CANCER SCREENING   Lung Cancer  Lung cancer screening is recommended for adults 86-4 years old who are at high risk for lung cancer because of a history of smoking.  A yearly low-dose CT scan of the lungs is recommended for people who:  Currently smoke.  Have quit within the past 15 years.  Have at least a 30-pack-year history of smoking. A pack year is smoking an average of one pack of cigarettes a day for 1 year.  Yearly screening should continue until it has been 15 years since you quit.  Yearly screening should stop if you develop a health problem that would prevent you from having lung cancer treatment.  Breast Cancer  Practice breast self-awareness. This means understanding how your breasts normally appear and feel.  It also means doing regular breast self-exams. Let your health care provider know about any changes, no matter how small.  If you are in your 20s or 30s, you should have a clinical breast exam (CBE) by a health care provider every 1-3  years as part of a regular health exam.  If you are 52 or older, have a CBE every year. Also consider having a breast X-ray (mammogram) every year.  If you have a family history of breast cancer, talk to your health care provider about genetic screening.  If you are at high risk for breast cancer, talk to your health care provider about having an MRI and a mammogram every year.  Breast cancer gene (BRCA) assessment is recommended for women who have family members with BRCA-related cancers. BRCA-related cancers include:  Breast.  Ovarian.  Tubal.  Peritoneal  cancers.  Results of the assessment will determine the need for genetic counseling and BRCA1 and BRCA2 testing. Cervical Cancer Your health care provider may recommend that you be screened regularly for cancer of the pelvic organs (ovaries, uterus, and vagina). This screening involves a pelvic examination, including checking for microscopic changes to the surface of your cervix (Pap test). You may be encouraged to have this screening done every 3 years, beginning at age 84.  For women ages 52-65, health care providers may recommend pelvic exams and Pap testing every 3 years, or they may recommend the Pap and pelvic exam, combined with testing for human papilloma virus (HPV), every 5 years. Some types of HPV increase your risk of cervical cancer. Testing for HPV may also be done on women of any age with unclear Pap test results.  Other health care providers may not recommend any screening for nonpregnant women who are considered low risk for pelvic cancer and who do not have symptoms. Ask your health care provider if a screening pelvic exam is right for you.  If you have had past treatment for cervical cancer or a condition that could lead to cancer, you need Pap tests and screening for cancer for at least 20 years after your treatment. If Pap tests have been discontinued, your risk factors (such as having a new sexual partner) need to be reassessed to determine if screening should resume. Some women have medical problems that increase the chance of getting cervical cancer. In these cases, your health care provider may recommend more frequent screening and Pap tests. Colorectal Cancer  This type of cancer can be detected and often prevented.  Routine colorectal cancer screening usually begins at 61 years of age and continues through 61 years of age.  Your health care provider may recommend screening at an earlier age if you have risk factors for colon cancer.  Your health care provider may also  recommend using home test kits to check for hidden blood in the stool.  A small camera at the end of a tube can be used to examine your colon directly (sigmoidoscopy or colonoscopy). This is done to check for the earliest forms of colorectal cancer.  Routine screening usually begins at age 31.  Direct examination of the colon should be repeated every 5-10 years through 61 years of age. However, you may need to be screened more often if early forms of precancerous polyps or small growths are found. Skin Cancer  Check your skin from head to toe regularly.  Tell your health care provider about any new moles or changes in moles, especially if there is a change in a mole's shape or color.  Also tell your health care provider if you have a mole that is larger than the size of a pencil eraser.  Always use sunscreen. Apply sunscreen liberally and repeatedly throughout the day.  Protect yourself by wearing long  sleeves, pants, a wide-brimmed hat, and sunglasses whenever you are outside. HEART DISEASE, DIABETES, AND HIGH BLOOD PRESSURE   High blood pressure causes heart disease and increases the risk of stroke. High blood pressure is more likely to develop in:  People who have blood pressure in the high end of the normal range (130-139/85-89 mm Hg).  People who are overweight or obese.  People who are African American.  If you are 44-28 years of age, have your blood pressure checked every 3-5 years. If you are 44 years of age or older, have your blood pressure checked every year. You should have your blood pressure measured twice--once when you are at a hospital or clinic, and once when you are not at a hospital or clinic. Record the average of the two measurements. To check your blood pressure when you are not at a hospital or clinic, you can use:  An automated blood pressure machine at a pharmacy.  A home blood pressure monitor.  If you are between 62 years and 107 years old, ask your health  care provider if you should take aspirin to prevent strokes.  Have regular diabetes screenings. This involves taking a blood sample to check your fasting blood sugar level.  If you are at a normal weight and have a low risk for diabetes, have this test once every three years after 61 years of age.  If you are overweight and have a high risk for diabetes, consider being tested at a younger age or more often. PREVENTING INFECTION  Hepatitis B  If you have a higher risk for hepatitis B, you should be screened for this virus. You are considered at high risk for hepatitis B if:  You were born in a country where hepatitis B is common. Ask your health care provider which countries are considered high risk.  Your parents were born in a high-risk country, and you have not been immunized against hepatitis B (hepatitis B vaccine).  You have HIV or AIDS.  You use needles to inject street drugs.  You live with someone who has hepatitis B.  You have had sex with someone who has hepatitis B.  You get hemodialysis treatment.  You take certain medicines for conditions, including cancer, organ transplantation, and autoimmune conditions. Hepatitis C  Blood testing is recommended for:  Everyone born from [birth year] through 1965.  Anyone with known risk factors for hepatitis C. Sexually transmitted infections (STIs)  You should be screened for sexually transmitted infections (STIs) including gonorrhea and chlamydia if:  You are sexually active and are younger than 61 years of age.  You are older than 61 years of age and your health care provider tells you that you are at risk for this type of infection.  Your sexual activity has changed since you were last screened and you are at an increased risk for chlamydia or gonorrhea. Ask your health care provider if you are at risk.  If you do not have HIV, but are at risk, it may be recommended that you take a prescription medicine daily to prevent HIV  infection. This is called pre-exposure prophylaxis (PrEP). You are considered at risk if:  You are sexually active and do not regularly use condoms or know the HIV status of your partner(s).  You take drugs by injection.  You are sexually active with a partner who has HIV. Talk with your health care provider about whether you are at high risk of being infected with HIV. If you choose  to begin PrEP, you should first be tested for HIV. You should then be tested every 3 months for as long as you are taking PrEP.  PREGNANCY   If you are premenopausal and you may become pregnant, ask your health care provider about preconception counseling.  If you may become pregnant, take 400 to 800 micrograms (mcg) of folic acid every day.  If you want to prevent pregnancy, talk to your health care provider about birth control (contraception). OSTEOPOROSIS AND MENOPAUSE   Osteoporosis is a disease in which the bones lose minerals and strength with aging. This can result in serious bone fractures. Your risk for osteoporosis can be identified using a bone density scan.  If you are 66 years of age or older, or if you are at risk for osteoporosis and fractures, ask your health care provider if you should be screened.  Ask your health care provider whether you should take a calcium or vitamin D supplement to lower your risk for osteoporosis.  Menopause may have certain physical symptoms and risks.  Hormone replacement therapy may reduce some of these symptoms and risks. Talk to your health care provider about whether hormone replacement therapy is right for you.  HOME CARE INSTRUCTIONS   Schedule regular health, dental, and eye exams.  Stay current with your immunizations.   Do not use any tobacco products including cigarettes, chewing tobacco, or electronic cigarettes.  If you are pregnant, do not drink alcohol.  If you are breastfeeding, limit how much and how often you drink alcohol.  Limit  alcohol intake to no more than 1 drink per day for nonpregnant women. One drink equals 12 ounces of beer, 5 ounces of wine, or 1 ounces of hard liquor.  Do not use street drugs.  Do not share needles.  Ask your health care provider for help if you need support or information about quitting drugs.  Tell your health care provider if you often feel depressed.  Tell your health care provider if you have ever been abused or do not feel safe at home.   This information is not intended to replace advice given to you by your health care provider. Make sure you discuss any questions you have with your health care provider.   Document Released: 04/11/2011 Document Revised: 10/17/2014 Document Reviewed: 08/28/2013 Elsevier Interactive Patient Education [employer].

## 2015-12-18 NOTE — Assessment & Plan Note (Signed)
Checking labs, given shingles shot to update her immunizations. Exercises regularly and non-smoker. Counseled about sun safety and mole surveillance.

## 2015-12-18 NOTE — Progress Notes (Signed)
   Subjective:    Patient ID: Brianna Gardner, female    DOB: Jul 11, 1956, 60 y.o.   MRN: [id number]  HPI The patient is a 60 YO female coming in for wellness. No new concerns. 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 Some mild arthritis but takes tylenol for pain without problems. Non-smoker.  PMH, [facility], social history reviewed and updated.   Review of Systems  Constitutional: Negative for fever, activity change, appetite change, fatigue and unexpected weight change.  HENT: Negative.   Eyes: Negative.   Respiratory: Negative for cough, chest tightness, shortness of breath and wheezing.   Cardiovascular: Negative for chest pain, palpitations and leg swelling.  Gastrointestinal: Negative for nausea, vomiting, abdominal pain, diarrhea and abdominal distention.  Musculoskeletal: Positive for arthralgias. Negative for myalgias and back pain.  Skin: Negative.   Neurological: Negative.   Psychiatric/Behavioral: Negative.       Objective:   Physical Exam  Constitutional: She is oriented to person, place, and time. She appears well-developed and well-nourished.  HENT:  Head: Normocephalic and atraumatic.  Eyes: EOM are normal.  Neck: Normal range of motion.  Cardiovascular: Normal rate and regular rhythm.   Pulmonary/Chest: Effort normal and breath sounds normal. No respiratory distress. She has no wheezes. She has no rales.  Abdominal: Soft. Bowel sounds are normal. She exhibits no distension. There is no tenderness. There is no rebound.  Musculoskeletal: She exhibits no edema.  Neurological: She is alert and oriented to person, place, and time. Coordination normal.  Skin: Skin is warm and dry.  Psychiatric: She has a normal mood and affect.   Filed Vitals:   12/18/15 0902  BP: 100/60  Pulse: 56  Temp: 98 F (36.7 C)  TempSrc: Oral  Resp: 14  Height: 5\' 6"  (1.676 m)  Weight: 132 lb 12.8 oz (60.238 kg)  SpO2: 97%      Assessment & Plan:

## 2015-12-18 NOTE — Progress Notes (Signed)
Pre visit review using our clinic review tool, if applicable. No additional management support is needed unless otherwise documented below in the visit note. 

## 2015-12-18 NOTE — Addendum Note (Signed)
Addended by: Resa Miner R on: 12/18/2015 04:56 PM   Modules accepted: Orders

## 2015-12-19 LAB — HEPATITIS C ANTIBODY: HCV Ab: NEGATIVE

## 2016-08-03 NOTE — Progress Notes (Signed)
*IMAGE* Office Visit Note  Patient: Brianna Gardner             Date of Birth: 1956-02-15           MRN: [id number]             PCP: Hoyt Koch, MD Referring: Hoyt Koch, * Visit Date: 08/04/2016    Subjective:  Follow-up   History of Present Illness: Brianna Gardner is a 60 y.o. female . She has osteoarthritis involving her hands and feet she reports pain in her bilateral CMC joints she has been using Aquadale brace intermittently. Also has some discomfort in PIP and DIP joints of her hands. The discomfort is minimal she's been using proper fitting shoes. She uses Tylenol  over-the-counter.  Activities of Daily Living:  Patient reports morning stiffness for 5 minutes.   Patient Denies nocturnal pain.  Difficulty dressing/grooming: Denies Difficulty climbing stairs: Denies Difficulty getting out of chair: Denies Difficulty using hands for taps, buttons, cutlery, and/or writing: Denies   Review of Systems  Musculoskeletal: Positive for arthralgias, joint pain and morning stiffness.  All other systems reviewed and are negative.   PMFS History:  Patient Active Problem List   Diagnosis Date Noted  . ANA positive 08/04/2016  . Routine general medical examination at a health care facility 12/18/2015  . Osteoarthritis   . Allergic rhinitis, cause unspecified   . Osteopenia     Past Medical History:  Diagnosis Date  . Allergic rhinitis, cause unspecified   . Osteoarthritis    hands, great toes B - follows with rheum  . Osteopenia 07/2009   DEXA 08/06/09: -1.9 fem    Family History  Problem Relation Age of Onset  . Arthritis Father   . Stroke Father 81  . Arthritis Mother   . Dementia Mother 54    mild-mod, in ALF, ambulatory  . Macular degeneration Mother 59    "dry"  . Stroke Paternal Grandmother   . Stroke Maternal Grandmother    Past Surgical History:  Procedure Laterality Date  . 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 She appears well-developed and well-nourished.  HENT:  Head: Normocephalic and atraumatic.  Eyes: Conjunctivae and EOM are normal.  Neck: Normal range of motion.  Cardiovascular: Normal rate, regular rhythm, normal heart sounds and intact distal pulses.   Pulmonary/Chest: Effort normal and breath sounds normal.  Abdominal: Soft. Bowel sounds are normal.  Lymphadenopathy:    She has no cervical adenopathy.  Neurological: She is alert and oriented to person, place, and time.  Skin: Skin is warm and dry. Capillary refill takes 2 to 3 seconds.  Psychiatric: She has a normal mood and affect. Her behavior is normal.     Musculoskeletal Exam: C-spine and thoracic lumbar spine with good range of motion. She had good range of motion of her shoulders elbows joints wrist joints. She had thickening of PIP/DIP joints with incomplete fist formation bilaterally. She is a small mucinous cyst present  over her right fourth DIP joint. Hip joints and knee joints ankles with good range of motion. He has thickening of bilateral first MTP joint.  CDAI Exam: No CDAI exam completed.    Investigation: Findings:  January 2013 ANA was 1:160 nuclear homogenous pattern.    Imaging: No results found.  Speciality Comments: No specialty comments available.    Procedures:  No procedures performed Allergies: Demerol and Penicillins   Assessment / Plan: Visit Diagnoses: Primary osteoarthritis of both feet: She has bilateral MTP joint thickening which causes some discomfort. Proper fitting shoes were discussed.  Primary  osteoarthritis of both hands: She has severe osteoarthritis of her PIP/DIP joints and CMC joints in her hands. Joint protection and muscle strengthening was discussed. Her pain is fairly well controlled with Tylenol. She is also using some natural supplements.  ANA positive - h/o: She has had low titer ANAs in the past. She has no clinical features of autoimmune disease at this time. We had discussion regarding clinical features of autoimmune disease. If she develops any new symptoms she supposed to notify me.     Follow-Up Instructions: Return in about 1 year (around 08/04/2017) for Osteoarthritis.  Bo Merino, MD

## 2016-08-04 ENCOUNTER — Ambulatory Visit (INDEPENDENT_AMBULATORY_CARE_PROVIDER_SITE_OTHER): Payer: BC Managed Care – PPO | Admitting: Rheumatology

## 2016-08-04 ENCOUNTER — Encounter: Payer: Self-pay | Admitting: Rheumatology

## 2016-08-04 VITALS — BP 94/50 | HR 73 | Resp 12 | Ht 67.0 in | Wt 132.0 lb

## 2016-08-04 DIAGNOSIS — M19041 Primary osteoarthritis, right hand: Secondary | ICD-10-CM | POA: Diagnosis not present

## 2016-08-04 DIAGNOSIS — R768 Other specified abnormal immunological findings in serum: Secondary | ICD-10-CM | POA: Diagnosis not present

## 2016-08-04 DIAGNOSIS — M19072 Primary osteoarthritis, left ankle and foot: Secondary | ICD-10-CM | POA: Diagnosis not present

## 2016-08-04 DIAGNOSIS — M19071 Primary osteoarthritis, right ankle and foot: Secondary | ICD-10-CM

## 2016-08-04 DIAGNOSIS — M19042 Primary osteoarthritis, left hand: Secondary | ICD-10-CM

## 2016-12-28 NOTE — Progress Notes (Signed)
Office Visit Note  Patient: Brianna Gardner             Date of Birth: 07/07/1956           MRN: [id number]             PCP: Hoyt Koch, MD Referring: Hoyt Koch, * Visit Date: 12/29/2016 Occupation: @GUAROCC @    Subjective:  Pain of the Right Knee   History of Present Illness: Brianna Gardner is a 61 y.o. female   c/o right hip pain  Right knee pain Since feb 2018 after doing exercise on a "yoga wall."      Activities of Daily Living:  Patient reports morning stiffness for 15 minutes.   Patient Reports nocturnal pain.  Difficulty dressing/grooming: Denies Difficulty climbing stairs: Reports Difficulty getting out of chair: Reports Difficulty using hands for taps, buttons, cutlery, and/or writing: Reports   Review of Systems  Constitutional: Negative for fatigue.  HENT: Negative for mouth sores and mouth dryness.   Eyes: Negative for dryness.  Respiratory: Negative for shortness of breath.   Gastrointestinal: Negative for constipation and diarrhea.  Musculoskeletal: Negative for myalgias and myalgias.  Skin: Negative for sensitivity to sunlight.  Psychiatric/Behavioral: Negative for decreased concentration and sleep disturbance.    PMFS History:  Patient Active Problem List   Diagnosis Date Noted  . ANA positive 08/04/2016  . Routine general medical examination at a health care facility 12/18/2015  . Osteoarthritis   . Allergic rhinitis, cause unspecified   . Osteopenia     Past Medical History:  Diagnosis Date  . Allergic rhinitis, cause unspecified   . Osteoarthritis    hands, great toes B - follows with rheum  . Osteopenia 07/2009   DEXA 08/06/09: -1.9 fem    Family History  Problem Relation Age of Onset  . Arthritis Father   . Stroke Father 33  . Arthritis Mother   . Dementia Mother 25    mild-mod, in ALF, ambulatory  . Macular degeneration Mother 25    "dry"  . Stroke Paternal Grandmother   . Stroke Maternal  Grandmother    Past Surgical History:  Procedure Laterality Date  . 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 She appears well-developed and well-nourished.  HENT:  Head: Normocephalic and atraumatic.  Eyes: EOM are normal. Pupils are equal, round, and reactive to light.  Cardiovascular: Normal rate, regular rhythm and normal heart sounds.  Exam reveals no gallop and no friction rub.   No murmur heard. Pulmonary/Chest: Effort normal and breath sounds normal. She has no wheezes. She has no rales.  Abdominal: Soft. Bowel sounds are normal. She exhibits no distension. There is no tenderness. There is no guarding. No hernia.  Musculoskeletal: Normal range of motion. She exhibits no edema, tenderness or deformity.  Lymphadenopathy:    She has no cervical adenopathy.  Neurological: She is alert and oriented to person, place, and time. Coordination normal.  Skin: Skin is warm and dry. Capillary refill takes less than 2 seconds. No rash noted.  Psychiatric:  She has a normal mood and affect. Her behavior is normal.  Nursing note and vitals reviewed.    Musculoskeletal Exam:  Full range of motion of all joints Grip strength is equal and strong bilaterally Fibromyalgia tender points are all absent  CDAI Exam: CDAI Homunculus Exam:   Joint Counts:  CDAI Tender Joint count: 0 CDAI Swollen Joint count: 0  Global Assessments:  Patient Global Assessment: 8 Provider Global Assessment: 8  CDAI Calculated Score: 16    Investigation: Findings:  Labs from 09/17/2013 show CMP with GFR normal, CBC with diff is normal, sed rate is normal at 9, vitamin D is  normal at 45.  Positive ANA in the past  Bone density through her primary care physician  No visits with results within 6 Month(s) from this visit.  Latest known visit with results is:  Appointment on 12/18/2015  Component Date Value Ref Range Status  . Sodium 12/18/2015 139  135 - 145 mEq/L Final  . Potassium 12/18/2015 4.3  3.5 - 5.1 mEq/L Final  . Chloride 12/18/2015 103  96 - 112 mEq/L Final  . CO2 12/18/2015 29  19 - 32 mEq/L Final  . Glucose, Bld 12/18/2015 93  70 - 99 mg/dL Final  . BUN 12/18/2015 16  6 - 23 mg/dL Final  . Creatinine, Ser 12/18/2015 0.79  0.40 - 1.20 mg/dL Final  . Total Bilirubin 12/18/2015 0.5  0.2 - 1.2 mg/dL Final  . Alkaline Phosphatase 12/18/2015 79  39 - 117 U/L Final  . AST 12/18/2015 25  0 - 37 U/L Final  . ALT 12/18/2015 20  0 - 35 U/L Final  . Total Protein 12/18/2015 8.1  6.0 - 8.3 g/dL Final  . Albumin 12/18/2015 4.5  3.5 - 5.2 g/dL Final  . Calcium 12/18/2015 9.5  8.4 - 10.5 mg/dL Final  . GFR 12/18/2015 78.60  >60.00 mL/min Final  . Cholesterol 12/18/2015 243* 0 - 200 mg/dL Final  . Triglycerides 12/18/2015 67.0  0.0 - 149.0 mg/dL Final  . HDL 12/18/2015 75.30  >39.00 mg/dL Final  . VLDL 12/18/2015 13.4  0.0 - 40.0 mg/dL Final  . LDL Cholesterol 12/18/2015 154* 0 - 99 mg/dL Final  . Total CHOL/HDL Ratio 12/18/2015 3   Final  . NonHDL 12/18/2015 167.43   Final  . WBC 12/18/2015 4.8  4.0 - 10.5 K/uL Final  . RBC 12/18/2015 4.57  3.87 - 5.11 Mil/uL Final  . Platelets 12/18/2015 251.0  150.0 - 400.0 K/uL Final  . Hemoglobin 12/18/2015 13.5  12.0 - 15.0 g/dL Final  . HCT 12/18/2015 39.9  36.0 - 46.0 % Final  . MCV 12/18/2015 87.4  78.0 - 100.0 fl Final  . MCHC 12/18/2015 33.9  30.0 - 36.0 g/dL Final  . RDW 12/18/2015 12.7  11.5 - 15.5 % Final  . HCV Ab 12/18/2015 NEGATIVE  NEGATIVE Final      Imaging: No results found.  Speciality Comments: No specialty comments available.    Procedures:  Large Joint Inj Date/Time: 12/29/2016 1:06  PM Performed by: Eliezer Lofts Authorized by: Eliezer Lofts   Consent Given by:  Patient Site marked: the procedure site was marked   Timeout: prior to procedure the correct patient, procedure, and site was verified   Indications:  Pain and joint swelling Location:  Knee Prep: patient was prepped and draped in usual sterile fashion   Needle Size:  27 G Needle Length:  1.5 inches Approach:  Medial Ultrasound Guidance: No   Fluoroscopic Guidance: No  Arthrogram: No   Medications:  1.5 mL lidocaine 1 %; 40 mg triamcinolone acetonide 40 MG/ML Aspiration Attempted: Yes   Patient tolerance:  Patient tolerated the procedure well with no immediate complications  Right knee was injected with 40 mg of Kenalog mixed with 1 1/2 mL one percent lidocaine.   Allergies: Demerol and Penicillins   Assessment / Plan:     Visit Diagnoses: Primary osteoarthritis of both hands - PIP and DIP thickening, mild PIP inflammatory change  Primary osteoarthritis of both feet - MTP and PIP changes without any inflammation  Post-menopausal - Plan: DG BONE DENSITY (DXA)  Acute bilateral knee pain - Plan: XR KNEE 3 VIEW RIGHT, XR KNEE 3 VIEW LEFT    Orders: Orders Placed This Encounter  Procedures  . Large Joint Injection/Arthrocentesis  . DG BONE DENSITY (DXA)  . XR KNEE 3 VIEW RIGHT  . XR KNEE 3 VIEW LEFT   Meds ordered this encounter  Medications  . baclofen (LIORESAL) 10 MG tablet    Sig: Take 1 tablet (10 mg total) by mouth 3 (three) times daily.    Dispense:  30 each    Refill:  0  . predniSONE (DELTASONE) 5 MG tablet    Sig: 3 tabs po x 3 days, then 2 tabs po x 3 days then 1 tab po x 3 days    Dispense:  18 tablet    Refill:  0    Face-to-face time spent with patient was 30 minutes. 50% of time was spent in counseling and coordination of care.  Follow-Up Instructions: No Follow-up on file.   Eliezer Lofts, PA-C  Note - This record has been created using [employer].    Chart creation errors have been sought, but may not always  have been located. Such creation errors do not reflect on  the standard of medical care.

## 2016-12-29 ENCOUNTER — Telehealth: Payer: Self-pay | Admitting: *Deleted

## 2016-12-29 ENCOUNTER — Ambulatory Visit (INDEPENDENT_AMBULATORY_CARE_PROVIDER_SITE_OTHER): Payer: Self-pay

## 2016-12-29 ENCOUNTER — Encounter: Payer: Self-pay | Admitting: Rheumatology

## 2016-12-29 ENCOUNTER — Ambulatory Visit (INDEPENDENT_AMBULATORY_CARE_PROVIDER_SITE_OTHER): Payer: BC Managed Care – PPO | Admitting: Rheumatology

## 2016-12-29 VITALS — BP 105/61 | HR 67 | Resp 12 | Ht 67.0 in | Wt 133.0 lb

## 2016-12-29 DIAGNOSIS — M25561 Pain in right knee: Secondary | ICD-10-CM

## 2016-12-29 DIAGNOSIS — M19041 Primary osteoarthritis, right hand: Secondary | ICD-10-CM

## 2016-12-29 DIAGNOSIS — M19071 Primary osteoarthritis, right ankle and foot: Secondary | ICD-10-CM

## 2016-12-29 DIAGNOSIS — M19072 Primary osteoarthritis, left ankle and foot: Secondary | ICD-10-CM

## 2016-12-29 DIAGNOSIS — Z780 Asymptomatic menopausal state: Secondary | ICD-10-CM

## 2016-12-29 DIAGNOSIS — M19042 Primary osteoarthritis, left hand: Secondary | ICD-10-CM | POA: Diagnosis not present

## 2016-12-29 DIAGNOSIS — M25562 Pain in left knee: Secondary | ICD-10-CM | POA: Diagnosis not present

## 2016-12-29 MED ORDER — PREDNISONE 5 MG PO TABS: ORAL_TABLET | ORAL | 0 refills | Status: DC

## 2016-12-29 MED ORDER — BACLOFEN 10 MG PO TABS: 10.0000 mg | ORAL_TABLET | Freq: Three times a day (TID) | ORAL | 0 refills | Status: AC

## 2016-12-29 MED ORDER — TRIAMCINOLONE ACETONIDE 40 MG/ML IJ SUSP
40.0000 mg | INTRAMUSCULAR | Status: AC | PRN
  Administered 2016-12-29: 40 mg via INTRA_ARTICULAR

## 2016-12-29 MED ORDER — LIDOCAINE HCL 1 % IJ SOLN
1.5000 mL | INTRAMUSCULAR | Status: AC | PRN
  Administered 2016-12-29: 1.5 mL

## 2016-12-29 NOTE — Telephone Encounter (Signed)
Patient advised and states she will contact the office if she wants to start the Visco supplementation.

## 2016-12-29 NOTE — Telephone Encounter (Signed)
-----   Message from Eliezer Lofts, [place] sent at 12/29/2016  1:18 PM EDT ----- Please advise patient that she has both knees with moderate osteoarthritis.  If patient is interested in [place], she can let us know. Euflexxa in both knees 3 would be my advice if her insurance company approves.  We can consider Hyalgan or Orthovisc as an alternative if that's what insurance prefers.  I wait for the patient to let me know if she wants to move forward with this.

## 2016-12-29 NOTE — Telephone Encounter (Signed)
Attempted to contact the patient and left message for patient to call the office.  

## 2017-01-03 ENCOUNTER — Telehealth: Payer: Self-pay | Admitting: Rheumatology

## 2017-01-03 NOTE — Telephone Encounter (Signed)
Patient would like to see the xrays for L knee and the "b[place]". States she isnt looking for appt, just wants to see xrays

## 2017-01-04 NOTE — Telephone Encounter (Signed)
Left message to advised she may come by to pick up cd with xrays on it.

## 2017-01-11 ENCOUNTER — Encounter: Payer: Self-pay | Admitting: Rheumatology

## 2017-01-18 ENCOUNTER — Telehealth: Payer: Self-pay | Admitting: *Deleted

## 2017-01-18 NOTE — Telephone Encounter (Addendum)
Spoke with patient and advised her we have received her bone density scan results. Patient advised Dr. Estanislado Pandy would like her to schedule an appointment to discuss the results in detail and treatment options. Patient transferred to the front to schedule an appointment.  T score -3.3 Lf

## 2017-01-18 NOTE — Telephone Encounter (Signed)
Left message for patient to call the office. Received patient's bone density scan results. Reviewed by Dr. Estanislado Pandy and patient needs appointment to discuss treatment options.

## 2017-01-24 DIAGNOSIS — M170 Bilateral primary osteoarthritis of knee: Secondary | ICD-10-CM | POA: Insufficient documentation

## 2017-01-24 DIAGNOSIS — M19071 Primary osteoarthritis, right ankle and foot: Secondary | ICD-10-CM | POA: Insufficient documentation

## 2017-01-24 DIAGNOSIS — M19072 Primary osteoarthritis, left ankle and foot: Secondary | ICD-10-CM

## 2017-01-24 NOTE — Progress Notes (Signed)
Office Visit Note  Patient: Brianna Gardner             Date of Birth: 10/30/1955           MRN: [id number]             PCP: Hoyt Koch, MD Referring: Hoyt Koch, * Visit Date: 02/07/2017 Occupation: @GUAROCC @    Subjective:  Discuss DEXA results    History of Present Illness: Meaghann Choo is a 61 y.o. female with a history of osteoarthritis in hands, feet, and knees.  Patient is here to discuss her DEXA scan results.  Patient states she is not having any joint pain or swelling since her last visit December 29, 2016 when she received a cortisone injection in her right knee.  Patient states in the past she has never been on any medications for osteoporosis.  She reports she takes Vitamin D3 2000 IU daily as well as Tums 500 mg for a source of calcium.  Patient states she also takes tumeric, tart cherry, ginger, and omega 3 to help with her OA.  Patient denies any changes in her past medical history since her last visit.  She states her family history is significant for a sister who has osteoporosis.  Patient states she frequently performs weight-bearing exercises, and she is very active at the [employer] where she works. She continues to have some stiffness and discomfort in her hands feet and knee joints but is tolerable. She denies any history of intake of thyroid medications, antiepileptics, PPIs or steroids.   Activities of Daily Living:  Patient reports morning stiffness for 2 minutes.   Patient Denies nocturnal pain.  Difficulty dressing/grooming: Denies Difficulty climbing stairs: Denies Difficulty getting out of chair: Denies Difficulty using hands for taps, buttons, cutlery, and/or writing: Denies   Review of Systems  Constitutional: Negative for fatigue, weight gain and weight loss.  HENT: Negative for mouth sores, mouth dryness and nose dryness.   Eyes: Negative for dryness.  Respiratory: Negative for cough, shortness of breath and difficulty breathing.     Cardiovascular: Negative for chest pain and palpitations.  Gastrointestinal: Negative for constipation, diarrhea, nausea and vomiting.  Genitourinary: Negative for painful urination.  Musculoskeletal: Positive for morning stiffness. Negative for arthralgias, joint pain, joint swelling, myalgias, muscle weakness, muscle tenderness and myalgias.  Skin: Negative for color change, rash, skin tightness, ulcers and sensitivity to sunlight.  Neurological: Negative for dizziness and headaches.  Hematological: Negative for swollen glands.  Psychiatric/Behavioral: Negative for depressed mood and sleep disturbance. The patient is not nervous/anxious.     PMFS History:  Patient Active Problem List   Diagnosis Date Noted  . Age-related osteoporosis without current pathological fracture 02/04/2017  . Primary osteoarthritis of both feet 01/24/2017  . Primary osteoarthritis of both knees 01/24/2017  . ANA positive 08/04/2016  . Routine general medical examination at a health care facility 12/18/2015  . Primary osteoarthritis of both hands   . Allergic rhinitis, cause unspecified     Past Medical History:  Diagnosis Date  . Allergic rhinitis, cause unspecified   . Osteoarthritis    hands, great toes B - follows with rheum  . Osteopenia 07/2009   DEXA 08/06/09: -1.9 fem    Family History  Problem Relation Age of Onset  . Arthritis Father   . Stroke Father 21  . Arthritis Mother   . Dementia Mother 54    mild-mod, in ALF, ambulatory  . Macular degeneration Mother 6    "  dry"  . Stroke Paternal Grandmother   . Stroke Maternal Grandmother    Past Surgical History:  Procedure Laterality Date  . 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 Shoulder joints. She has severe CMC PIP/DIP thickening consistent with osteoarthritis. Hip joints knee joints and range of motion. She is some crepitus in her knee joints. No warmth or effusion or swelling was noted. She does have bilateral first MTP thickening of PIP/DIP thickening in her feet consistent with osteoarthritis  CDAI Exam: No CDAI exam completed.    Investigation: Findings:  01/11/2017 DEXA T score -3.3 left femoral neck   No visits with results within 3 Month(s) from this visit.  Latest known visit with results is:  Appointment on 12/18/2015  Component Date Value Ref Range Status  . Sodium 12/18/2015 139  135 - 145 mEq/L Final  . Potassium 12/18/2015 4.3  3.5 - 5.1 mEq/L Final  . Chloride 12/18/2015 103  96 - 112 mEq/L Final  . CO2 12/18/2015 29  19 - 32 mEq/L Final  . Glucose, Bld 12/18/2015 93  70 - 99 mg/dL Final  . BUN 12/18/2015 16  6 - 23 mg/dL Final  . Creatinine, Ser 12/18/2015 0.79  0.40 - 1.20 mg/dL Final  . Total Bilirubin 12/18/2015 0.5  0.2 - 1.2 mg/dL Final  . Alkaline Phosphatase 12/18/2015 79  39 - 117 U/L Final  . AST 12/18/2015 25  0 - 37 U/L Final  . ALT 12/18/2015 20  0 - 35 U/L Final  . Total Protein 12/18/2015 8.1  6.0 - 8.3 g/dL Final  . Albumin 12/18/2015 4.5  3.5 - 5.2 g/dL Final  . Calcium 12/18/2015 9.5  8.4 - 10.5 mg/dL Final  . GFR 12/18/2015 78.60  >60.00 mL/min Final  . Cholesterol 12/18/2015 243* 0 - 200 mg/dL Final  . Triglycerides 12/18/2015 67.0  0.0 - 149.0 mg/dL Final  . HDL 12/18/2015 75.30  >39.00 mg/dL Final  . VLDL 12/18/2015 13.4  0.0 - 40.0 mg/dL Final  . LDL Cholesterol 12/18/2015 154* 0 - 99 mg/dL Final  . Total CHOL/HDL Ratio 12/18/2015 3   Final  . NonHDL 12/18/2015 167.43   Final  . WBC 12/18/2015 4.8  4.0 - 10.5 K/uL Final  . RBC 12/18/2015 4.57  3.87 -  5.11 Mil/uL Final  . Platelets 12/18/2015 251.0  150.0 - 400.0 K/uL Final  . Hemoglobin 12/18/2015 13.5  12.0 - 15.0 g/dL Final  . HCT 12/18/2015 39.9  36.0 - 46.0 % Final  . MCV 12/18/2015 87.4  78.0 - 100.0 fl Final  . MCHC 12/18/2015 33.9  30.0 - 36.0 g/dL Final  . RDW 12/18/2015 12.7  11.5 - 15.5 % Final  . HCV Ab 12/18/2015 NEGATIVE  NEGATIVE Final    Imaging: No results found.  Speciality Comments: No specialty comments available.    Procedures:  No procedures performed Allergies: Demerol and Penicillins   Assessment / Plan:     Visit Diagnoses: Age-related osteoporosis without current pathological  fracture - DEXA 01/11/2017 T score -3.3 left femoral neck. Detailed counseling regarding osteoporosis was provided. She is on calcium and vitamin D. Dosing was also emphasized. She's been doing resistive exercises. She is also a Electronics engineer. After discussing different treatment options and their side effects we decided to start her on Fosamax. We'll check following labs today. Prescription for Fosamax 70 mg by mouth every week was called in. - Plan: PTH, intact and calcium, VITAMIN D 25 Hydroxy (Vit-D Deficiency, Fractures), Magnesium, TSH, Serum protein electrophoresis with reflex  Enostosis of distal femur - left, it with an incidental finding. She is a symptomatic. - Plan: MR FEMUR LEFT W WO CONTRAST  Primary osteoarthritis of both hands: Chronic pain  Primary osteoarthritis of both knees: Chronic pain. X-rays were reviewed today. Joint protection and muscle strengthening discussed.  Primary osteoarthritis of both feet: She has chronic changes from osteoarthritis  ANA positive - 1:160, ENA neg, C3, C4 normal. No clinical features of autoimmune disease    Orders: Orders Placed This Encounter  Procedures  . MR FEMUR LEFT W WO CONTRAST  . PTH, intact and calcium  . VITAMIN D 25 Hydroxy (Vit-D Deficiency, Fractures)  . Magnesium  . TSH  . Serum protein  electrophoresis with reflex  . COMPLETE METABOLIC PANEL WITH GFR   Meds ordered this encounter  Medications  . alendronate (FOSAMAX) 70 MG tablet    Sig: Take 1 tablet (70 mg total) by mouth once a week. Take with a full glass of water on an empty stomach.    Dispense:  12 tablet    Refill:  1    Face-to-face time spent with patient was 30 minutes. 50% of time was spent in counseling and coordination of care.  Follow-Up Instructions: Return in about 3 months (around 05/10/2017) for Osteoporosis.   Bo Merino, MD  Note - This record has been created using [employer].  Chart creation errors have been sought, but may not always  have been located. Such creation errors do not reflect on  the standard of medical care.

## 2017-02-04 DIAGNOSIS — M810 Age-related osteoporosis without current pathological fracture: Secondary | ICD-10-CM | POA: Insufficient documentation

## 2017-02-07 ENCOUNTER — Encounter: Payer: Self-pay | Admitting: Rheumatology

## 2017-02-07 ENCOUNTER — Ambulatory Visit (INDEPENDENT_AMBULATORY_CARE_PROVIDER_SITE_OTHER): Payer: BC Managed Care – PPO | Admitting: Rheumatology

## 2017-02-07 VITALS — BP 110/60 | HR 68 | Resp 14 | Ht 67.0 in | Wt 134.0 lb

## 2017-02-07 DIAGNOSIS — Q799 Congenital malformation of musculoskeletal system, unspecified: Secondary | ICD-10-CM

## 2017-02-07 DIAGNOSIS — M19072 Primary osteoarthritis, left ankle and foot: Secondary | ICD-10-CM | POA: Diagnosis not present

## 2017-02-07 DIAGNOSIS — R768 Other specified abnormal immunological findings in serum: Secondary | ICD-10-CM

## 2017-02-07 DIAGNOSIS — M810 Age-related osteoporosis without current pathological fracture: Secondary | ICD-10-CM | POA: Diagnosis not present

## 2017-02-07 DIAGNOSIS — M19071 Primary osteoarthritis, right ankle and foot: Secondary | ICD-10-CM

## 2017-02-07 DIAGNOSIS — M19041 Primary osteoarthritis, right hand: Secondary | ICD-10-CM

## 2017-02-07 DIAGNOSIS — M170 Bilateral primary osteoarthritis of knee: Secondary | ICD-10-CM | POA: Diagnosis not present

## 2017-02-07 DIAGNOSIS — M19042 Primary osteoarthritis, left hand: Secondary | ICD-10-CM | POA: Diagnosis not present

## 2017-02-07 DIAGNOSIS — M898X5 Other specified disorders of bone, thigh: Secondary | ICD-10-CM

## 2017-02-07 LAB — TSH: TSH: 1.34 mIU/L

## 2017-02-07 MED ORDER — ALENDRONATE SODIUM 70 MG PO TABS: 70.0000 mg | ORAL_TABLET | ORAL | 1 refills | Status: DC

## 2017-02-07 NOTE — Patient Instructions (Signed)
Alendronate tablets  What is this medicine?  ALENDRONATE (a LEN droe nate) slows calcium loss from bones. It helps to make normal healthy bone and to slow bone loss in people with Paget's disease and osteoporosis. It may be used in others at risk for bone loss.  This medicine may be used for other purposes; ask your health care provider or pharmacist if you have questions.  COMMON BRAND NAME(S): Fosamax  What should I tell my health care provider before I take this medicine?  They need to know if you have any of these conditions:  -dental disease  -esophagus, stomach, or intestine problems, like acid reflux or GERD  -kidney disease  -low blood calcium  -low vitamin D  -problems sitting or standing 30 minutes  -trouble swallowing  -an unusual or allergic reaction to alendronate, other medicines, foods, dyes, or preservatives  -pregnant or trying to get pregnant  -breast-feeding  How should I use this medicine?  You must take this medicine exactly as directed or you will lower the amount of the medicine you absorb into your body or you may cause yourself harm. Take this medicine by mouth first thing in the morning, after you are up for the day. Do not eat or drink anything before you take your medicine. Swallow the tablet with a full glass (6 to 8 fluid ounces) of plain water. Do not take this medicine with any other drink. Do not chew or crush the tablet. After taking this medicine, do not eat breakfast, drink, or take any medicines or vitamins for at least 30 minutes. Sit or stand up for at least 30 minutes after you take this medicine; do not lie down. Do not take your medicine more often than directed.  Talk to your pediatrician regarding the use of this medicine in children. Special care may be needed.  Overdosage: If you think you have taken too much of this medicine contact a poison control center or emergency room at once.  NOTE: This medicine is only for you. Do not share this medicine with others.  What if I  miss a dose?  If you miss a dose, do not take it later in the day. Continue your normal schedule starting the next morning. Do not take double or extra doses.  What may interact with this medicine?  -aluminum hydroxide  -antacids  -aspirin  -calcium supplements  -drugs for inflammation like ibuprofen, naproxen, and others  -iron supplements  -magnesium supplements  -vitamins with minerals  This list may not describe all possible interactions. Give your health care provider a list of all the medicines, herbs, non-prescription drugs, or dietary supplements you use. Also tell them if you smoke, drink alcohol, or use illegal drugs. Some items may interact with your medicine.  What should I watch for while using this medicine?  Visit your doctor or health care professional for regular checks ups. It may be some time before you see benefit from this medicine. Do not stop taking your medicine except on your doctor's advice. Your doctor or health care professional may order blood tests and other tests to see how you are doing.  You should make sure you get enough calcium and vitamin D while you are taking this medicine, unless your doctor tells you not to. Discuss the foods you eat and the vitamins you take with your health care professional.  Some people who take this medicine have severe bone, joint, and/or muscle pain. This medicine may also increase your risk   for a broken thigh bone. Tell your doctor right away if you have pain in your upper leg or groin. Tell your doctor if you have any pain that does not go away or that gets worse.  This medicine can make you more sensitive to the sun. If you get a rash while taking this medicine, sunlight may cause the rash to get worse. Keep out of the sun. If you cannot avoid being in the sun, wear protective clothing and use sunscreen. Do not use sun lamps or tanning beds/booths.  What side effects may I notice from receiving this medicine?  Side effects that you should report to  your doctor or health care professional as soon as possible:  -allergic reactions like skin rash, itching or hives, swelling of the face, lips, or tongue  -black or tarry stools  -bone, muscle or joint pain  -changes in vision  -chest pain  -heartburn or stomach pain  -jaw pain, especially after dental work  -pain or trouble when swallowing  -redness, blistering, peeling or loosening of the skin, including inside the mouth  Side effects that usually do not require medical attention (report to your doctor or health care professional if they continue or are bothersome):  -changes in taste  -diarrhea or constipation  -eye pain or itching  -headache  -nausea or vomiting  -stomach gas or fullness  This list may not describe all possible side effects. Call your doctor for medical advice about side effects. You may report side effects to FDA at 1-800-FDA-1088.  Where should I keep my medicine?  Keep out of the reach of children.  Store at room temperature of 15 and 30 degrees C (59 and 86 degrees F). Throw away any unused medicine after the expiration date.  NOTE: This sheet is a summary. It may not cover all possible information. If you have questions about this medicine, talk to your doctor, pharmacist, or health care provider.   2018 Elsevier/Gold Standard (2011-03-25 08:56:09)

## 2017-02-07 NOTE — Progress Notes (Signed)
Pharmacy Note  Subjective: Patient presents today to the [facility] to see Dr. Estanislado Pandy.  Patient seen by pharmacist for counseling on bisphosphonate therapy.    Objective: T-score: -3.3 (01/11/2017) Calcium: 9.5 mg/dL (12/18/15), updated lab ordered today Vitamin D: ordered today   Assessment/Plan: 1. Medication review:  Counseled patient that alendronate (Fosamax) is an oral bisphosphonate that reduces bone turnover by inhibiting osteoclasts that chew up bone.  Counseled patient on purpose, proper use, and adverse effects of alendronate.  Reviewed with patient that alendronate should be taken once weekly.  It must be taking first thing in the morning, with a full glass of water, and she must wait an hour prior to eating food.  Also advised patient that she should not lie down until after she has eaten.  Reviewed importance of taking calcium and vitamin D with bisphosphonate therapy.  Patient confirms she is already taking calcium/vitamin D.  Provided patient with medication education material and answered all questions.  Reviewed adverse events of alendronate including risk of nausea & diarrhea, headache, and muscle & bone pain.  Reviewed rare adverse effect of osteonecrosis of the jaw and advised patient to alert her dentist that she is on alendronate prior to any major dental work.  Patient confirms she does not have any major dental work scheduled at this time.  Patient agrees to trial of alendronate at this time.     Elisabeth Most, Pharm.D., BCPS Clinical Pharmacist Pager: [phone] Phone: [phone] 02/07/2017 4:12 PM

## 2017-02-08 ENCOUNTER — Telehealth (INDEPENDENT_AMBULATORY_CARE_PROVIDER_SITE_OTHER): Payer: Self-pay | Admitting: *Deleted

## 2017-02-08 LAB — COMPLETE METABOLIC PANEL WITH GFR
ALBUMIN: 4.3 g/dL (ref 3.6–5.1)
ALK PHOS: 69 U/L (ref 33–130)
ALT: 15 U/L (ref 6–29)
AST: 21 U/L (ref 10–35)
BUN: 18 mg/dL (ref 7–25)
CALCIUM: 9.2 mg/dL (ref 8.6–10.4)
CO2: 24 mmol/L (ref 20–31)
Chloride: 103 mmol/L (ref 98–110)
Creat: 0.79 mg/dL (ref 0.50–0.99)
GFR, EST NON AFRICAN AMERICAN: 80 mL/min (ref >=60)
GLUCOSE: 81 mg/dL (ref 65–99)
POTASSIUM: 4 mmol/L (ref 3.5–5.3)
SODIUM: 137 mmol/L (ref 135–146)
Total Bilirubin: 0.5 mg/dL (ref 0.2–1.2)
Total Protein: 7.3 g/dL (ref 6.1–8.1)

## 2017-02-08 LAB — MAGNESIUM: Magnesium: 2.2 mg/dL (ref 1.5–2.5)

## 2017-02-08 LAB — PTH, INTACT AND CALCIUM
CALCIUM: 9.2 mg/dL (ref 8.6–10.4)
PTH: 35 pg/mL (ref 14–64)

## 2017-02-08 LAB — VITAMIN D 25 HYDROXY (VIT D DEFICIENCY, FRACTURES): VIT D 25 HYDROXY: 36 ng/mL (ref 30–100)

## 2017-02-08 NOTE — Telephone Encounter (Signed)
Pt is scheduled for MRI at [facility] on Sat May 5 at 1pm, pt needs to arrive at 12:45p to register thru the ED and let them know here for outpt MRI appt, Left message on both home and mobile to return call for appt info.

## 2017-02-09 LAB — PROTEIN ELECTROPHORESIS, SERUM, WITH REFLEX
ALBUMIN ELP: 4.4 g/dL (ref 3.8–4.8)
ALPHA-1-GLOBULIN: 0.2 g/dL (ref 0.2–0.3)
ALPHA-2-GLOBULIN: 0.7 g/dL (ref 0.5–0.9)
Abnormal Protein Band1: 0.5 g/dL
Beta 2: 0.3 g/dL (ref 0.2–0.5)
Beta Globulin: 0.4 g/dL (ref 0.4–0.6)
Gamma Globulin: 1.4 g/dL (ref 0.8–1.7)
TOTAL PROTEIN, SERUM ELECTROPHOR: 7.3 g/dL (ref 6.1–8.1)

## 2017-02-09 NOTE — Telephone Encounter (Signed)
Pt aware of appt.

## 2017-02-09 NOTE — Telephone Encounter (Signed)
Called pt again to return my call for appt information.

## 2017-02-11 ENCOUNTER — Ambulatory Visit (HOSPITAL_COMMUNITY): Admission: RE | Admit: 2017-02-11 | Payer: BC Managed Care – PPO | Source: Ambulatory Visit

## 2017-02-15 LAB — IFE INTERPRETATION

## 2017-02-15 NOTE — Progress Notes (Signed)
WNL

## 2017-02-20 ENCOUNTER — Ambulatory Visit (HOSPITAL_COMMUNITY)
Admission: RE | Admit: 2017-02-20 | Discharge: 2017-02-20 | Disposition: A | Discharge status: Home / Self Care | Payer: BC Managed Care – PPO | Source: Ambulatory Visit | Attending: Rheumatology | Admitting: Rheumatology

## 2017-02-20 DIAGNOSIS — M899 Disorder of bone, unspecified: Secondary | ICD-10-CM | POA: Diagnosis not present

## 2017-02-20 DIAGNOSIS — M948X6 Other specified disorders of cartilage, lower leg: Secondary | ICD-10-CM | POA: Diagnosis not present

## 2017-02-20 DIAGNOSIS — M898X5 Other specified disorders of bone, thigh: Secondary | ICD-10-CM

## 2017-02-20 DIAGNOSIS — Q799 Congenital malformation of musculoskeletal system, unspecified: Secondary | ICD-10-CM | POA: Insufficient documentation

## 2017-02-20 DIAGNOSIS — M25462 Effusion, left knee: Secondary | ICD-10-CM | POA: Insufficient documentation

## 2017-02-20 DIAGNOSIS — M7122 Synovial cyst of popliteal space [Baker], left knee: Secondary | ICD-10-CM | POA: Diagnosis not present

## 2017-02-20 LAB — POCT I-STAT CREATININE: Creatinine, Ser: 0.7 mg/dL (ref 0.44–1.00)

## 2017-02-20 MED ORDER — GADOBENATE DIMEGLUMINE 529 MG/ML IV SOLN: 15.0000 mL | Freq: Once | INTRAVENOUS | Status: DC | PRN

## 2017-02-20 NOTE — Progress Notes (Signed)
Please notify Pt. That MRI showed a benign tumor

## 2017-02-22 ENCOUNTER — Telehealth: Payer: Self-pay | Admitting: Rheumatology

## 2017-02-22 NOTE — Telephone Encounter (Signed)
Patient returning Andrea's call. °

## 2017-02-22 NOTE — Telephone Encounter (Signed)
Patient advised of MRI results and verbalized understanding.

## 2017-03-04 MED ORDER — GADOBENATE DIMEGLUMINE 529 MG/ML IV SOLN
15.0000 mL | Freq: Once | INTRAVENOUS | Status: AC | PRN
  Administered 2017-03-04: 12 mL via INTRAVENOUS

## 2017-07-12 ENCOUNTER — Other Ambulatory Visit (INDEPENDENT_AMBULATORY_CARE_PROVIDER_SITE_OTHER): Payer: BC Managed Care – PPO

## 2017-07-12 ENCOUNTER — Encounter: Payer: Self-pay | Admitting: Internal Medicine

## 2017-07-12 ENCOUNTER — Ambulatory Visit (INDEPENDENT_AMBULATORY_CARE_PROVIDER_SITE_OTHER): Payer: BC Managed Care – PPO | Admitting: Internal Medicine

## 2017-07-12 ENCOUNTER — Telehealth: Payer: Self-pay

## 2017-07-12 VITALS — BP 112/70 | HR 59 | Temp 98.0°F | Ht 67.0 in | Wt 131.0 lb

## 2017-07-12 DIAGNOSIS — Z0000 Encounter for general adult medical examination without abnormal findings: Secondary | ICD-10-CM

## 2017-07-12 DIAGNOSIS — Z23 Encounter for immunization: Secondary | ICD-10-CM

## 2017-07-12 DIAGNOSIS — M810 Age-related osteoporosis without current pathological fracture: Secondary | ICD-10-CM | POA: Diagnosis not present

## 2017-07-12 LAB — LIPID PANEL
CHOL/HDL RATIO: 3
Cholesterol: 255 mg/dL — ABNORMAL HIGH (ref 0–200)
HDL: 73.9 mg/dL (ref >39.00)
LDL Cholesterol: 164 mg/dL — ABNORMAL HIGH (ref 0–99)
NONHDL: 180.6
Triglycerides: 85 mg/dL (ref 0.0–149.0)
VLDL: 17 mg/dL (ref 0.0–40.0)

## 2017-07-12 LAB — COMPREHENSIVE METABOLIC PANEL
ALT: 13 U/L (ref 0–35)
AST: 20 U/L (ref 0–37)
Albumin: 4.5 g/dL (ref 3.5–5.2)
Alkaline Phosphatase: 60 U/L (ref 39–117)
BILIRUBIN TOTAL: 0.7 mg/dL (ref 0.2–1.2)
BUN: 15 mg/dL (ref 6–23)
CO2: 28 meq/L (ref 19–32)
Calcium: 9.4 mg/dL (ref 8.4–10.5)
Chloride: 100 mEq/L (ref 96–112)
Creatinine, Ser: 0.81 mg/dL (ref 0.40–1.20)
GFR: 75.98 mL/min (ref >60.00)
GLUCOSE: 93 mg/dL (ref 70–99)
Potassium: 4.1 mEq/L (ref 3.5–5.1)
SODIUM: 136 meq/L (ref 135–145)
Total Protein: 7.9 g/dL (ref 6.0–8.3)

## 2017-07-12 LAB — CBC
HCT: 39.1 % (ref 36.0–46.0)
Hemoglobin: 13.4 g/dL (ref 12.0–15.0)
MCHC: 34.2 g/dL (ref 30.0–36.0)
MCV: 88.9 fl (ref 78.0–100.0)
PLATELETS: 283 10*3/uL (ref 150.0–400.0)
RBC: 4.4 Mil/uL (ref 3.87–5.11)
RDW: 13.1 % (ref 11.5–15.5)
WBC: 4.6 10*3/uL (ref 4.0–10.5)

## 2017-07-12 LAB — TSH: TSH: 1.07 u[IU]/mL (ref 0.35–4.50)

## 2017-07-12 NOTE — Patient Instructions (Signed)
We will check the labs today and send them on mychart.   Health Maintenance, Female Adopting a healthy lifestyle and getting preventive care can go a long way to promote health and wellness. Talk with your health care provider about what schedule of regular examinations is right for you. This is a good chance for you to check in with your provider about disease prevention and staying healthy. In between checkups, there are plenty of things you can do on your own. Experts have done a lot of research about which lifestyle changes and preventive measures are most likely to keep you healthy. Ask your health care provider for more information. Weight and diet Eat a healthy diet  Be sure to include plenty of vegetables, fruits, low-fat dairy products, and lean protein.  Do not eat a lot of foods high in solid fats, added sugars, or salt.  Get regular exercise. This is one of the most important things you can do for your health. ? Most adults should exercise for at least 150 minutes each week. The exercise should increase your heart rate and make you sweat (moderate-intensity exercise). ? Most adults should also do strengthening exercises at least twice a week. This is in addition to the moderate-intensity exercise.  Maintain a healthy weight  Body mass index (BMI) is a measurement that can be used to identify possible weight problems. It estimates body fat based on height and weight. Your health care provider can help determine your BMI and help you achieve or maintain a healthy weight.  For females 49 years of age and older: ? A BMI below 18.5 is considered underweight. ? A BMI of 18.5 to 24.9 is normal. ? A BMI of 25 to 29.9 is considered overweight. ? A BMI of 30 and above is considered obese.  Watch levels of cholesterol and blood lipids  You should start having your blood tested for lipids and cholesterol at 62 years of age, then have this test every 5 years.  You may need to have your  cholesterol levels checked more often if: ? Your lipid or cholesterol levels are high. ? You are older than 62 years of age. ? You are at high risk for heart disease.  Cancer screening Lung Cancer  Lung cancer screening is recommended for adults 62-55 years old who are at high risk for lung cancer because of a history of smoking.  A yearly low-dose CT scan of the lungs is recommended for people who: ? Currently smoke. ? Have quit within the past 15 years. ? Have at least a 30-pack-year history of smoking. A pack year is smoking an average of one pack of cigarettes a day for 1 year.  Yearly screening should continue until it has been 15 years since you quit.  Yearly screening should stop if you develop a health problem that would prevent you from having lung cancer treatment.  Breast Cancer  Practice breast self-awareness. This means understanding how your breasts normally appear and feel.  It also means doing regular breast self-exams. Let your health care provider know about any changes, no matter how small.  If you are in your 20s or 30s, you should have a clinical breast exam (CBE) by a health care provider every 1-3 years as part of a regular health exam.  If you are 62 or older, have a CBE every year. Also consider having a breast X-ray (mammogram) every year.  If you have a family history of breast cancer, talk to your  health care provider about genetic screening.  If you are at high risk for breast cancer, talk to your health care provider about having an MRI and a mammogram every year.  Breast cancer gene (BRCA) assessment is recommended for women who have family members with BRCA-related cancers. BRCA-related cancers include: ? Breast. ? Ovarian. ? Tubal. ? Peritoneal cancers.  Results of the assessment will determine the need for genetic counseling and BRCA1 and BRCA2 testing.  Cervical Cancer Your health care provider may recommend that you be screened regularly  for cancer of the pelvic organs (ovaries, uterus, and vagina). This screening involves a pelvic examination, including checking for microscopic changes to the surface of your cervix (Pap test). You may be encouraged to have this screening done every 3 years, beginning at age 62.  For women ages 29-65, health care providers may recommend pelvic exams and Pap testing every 3 years, or they may recommend the Pap and pelvic exam, combined with testing for human papilloma virus (HPV), every 5 years. Some types of HPV increase your risk of cervical cancer. Testing for HPV may also be done on women of any age with unclear Pap test results.  Other health care providers may not recommend any screening for nonpregnant women who are considered low risk for pelvic cancer and who do not have symptoms. Ask your health care provider if a screening pelvic exam is right for you.  If you have had past treatment for cervical cancer or a condition that could lead to cancer, you need Pap tests and screening for cancer for at least 20 years after your treatment. If Pap tests have been discontinued, your risk factors (such as having a new sexual partner) need to be reassessed to determine if screening should resume. Some women have medical problems that increase the chance of getting cervical cancer. In these cases, your health care provider may recommend more frequent screening and Pap tests.  Colorectal Cancer  This type of cancer can be detected and often prevented.  Routine colorectal cancer screening usually begins at 62 years of age and continues through 62 years of age.  Your health care provider may recommend screening at an earlier age if you have risk factors for colon cancer.  Your health care provider may also recommend using home test kits to check for hidden blood in the stool.  A small camera at the end of a tube can be used to examine your colon directly (sigmoidoscopy or colonoscopy). This is done to  check for the earliest forms of colorectal cancer.  Routine screening usually begins at age 62.  Direct examination of the colon should be repeated every 5-10 years through 62 years of age. However, you may need to be screened more often if early forms of precancerous polyps or small growths are found.  Skin Cancer  Check your skin from head to toe regularly.  Tell your health care provider about any new moles or changes in moles, especially if there is a change in a mole's shape or color.  Also tell your health care provider if you have a mole that is larger than the size of a pencil eraser.  Always use sunscreen. Apply sunscreen liberally and repeatedly throughout the day.  Protect yourself by wearing long sleeves, pants, a wide-brimmed hat, and sunglasses whenever you are outside.  Heart disease, diabetes, and high blood pressure  High blood pressure causes heart disease and increases the risk of stroke. High blood pressure is more likely to  develop in: ? People who have blood pressure in the high end of the normal range (130-139/85-89 mm Hg). ? People who are overweight or obese. ? People who are African American.  If you are 33-59 years of age, have your blood pressure checked every 3-5 years. If you are 66 years of age or older, have your blood pressure checked every year. You should have your blood pressure measured twice-once when you are at a hospital or clinic, and once when you are not at a hospital or clinic. Record the average of the two measurements. To check your blood pressure when you are not at a hospital or clinic, you can use: ? An automated blood pressure machine at a pharmacy. ? A home blood pressure monitor.  If you are between 20 years and 51 years old, ask your health care provider if you should take aspirin to prevent strokes.  Have regular diabetes screenings. This involves taking a blood sample to check your fasting blood sugar level. ? If you are at a  normal weight and have a low risk for diabetes, have this test once every three years after 62 years of age. ? If you are overweight and have a high risk for diabetes, consider being tested at a younger age or more often. Preventing infection Hepatitis B  If you have a higher risk for hepatitis B, you should be screened for this virus. You are considered at high risk for hepatitis B if: ? You were born in a country where hepatitis B is common. Ask your health care provider which countries are considered high risk. ? Your parents were born in a high-risk country, and you have not been immunized against hepatitis B (hepatitis B vaccine). ? You have HIV or AIDS. ? You use needles to inject street drugs. ? You live with someone who has hepatitis B. ? You have had sex with someone who has hepatitis B. ? You get hemodialysis treatment. ? You take certain medicines for conditions, including cancer, organ transplantation, and autoimmune conditions.  Hepatitis C  Blood testing is recommended for: ? Everyone born from [birth year] through 1966. ? Anyone with known risk factors for hepatitis C.  Sexually transmitted infections (STIs)  You should be screened for sexually transmitted infections (STIs) including gonorrhea and chlamydia if: ? You are sexually active and are younger than 62 years of age. ? You are older than 62 years of age and your health care provider tells you that you are at risk for this type of infection. ? Your sexual activity has changed since you were last screened and you are at an increased risk for chlamydia or gonorrhea. Ask your health care provider if you are at risk.  If you do not have HIV, but are at risk, it may be recommended that you take a prescription medicine daily to prevent HIV infection. This is called pre-exposure prophylaxis (PrEP). You are considered at risk if: ? You are sexually active and do not regularly use condoms or know the HIV status of your  partner(s). ? You take drugs by injection. ? You are sexually active with a partner who has HIV.  Talk with your health care provider about whether you are at high risk of being infected with HIV. If you choose to begin PrEP, you should first be tested for HIV. You should then be tested every 3 months for as long as you are taking PrEP. Pregnancy  If you are premenopausal and you may become pregnant,  ask your health care provider about preconception counseling.  If you may become pregnant, take 400 to 800 micrograms (mcg) of folic acid every day.  If you want to prevent pregnancy, talk to your health care provider about birth control (contraception). Osteoporosis and menopause  Osteoporosis is a disease in which the bones lose minerals and strength with aging. This can result in serious bone fractures. Your risk for osteoporosis can be identified using a bone density scan.  If you are 42 years of age or older, or if you are at risk for osteoporosis and fractures, ask your health care provider if you should be screened.  Ask your health care provider whether you should take a calcium or vitamin D supplement to lower your risk for osteoporosis.  Menopause may have certain physical symptoms and risks.  Hormone replacement therapy may reduce some of these symptoms and risks. Talk to your health care provider about whether hormone replacement therapy is right for you. Follow these instructions at home:  Schedule regular health, dental, and eye exams.  Stay current with your immunizations.  Do not use any tobacco products including cigarettes, chewing tobacco, or electronic cigarettes.  If you are pregnant, do not drink alcohol.  If you are breastfeeding, limit how much and how often you drink alcohol.  Limit alcohol intake to no more than 1 drink per day for nonpregnant women. One drink equals 12 ounces of beer, 5 ounces of wine, or 1 ounces of hard liquor.  Do not use street  drugs.  Do not share needles.  Ask your health care provider for help if you need support or information about quitting drugs.  Tell your health care provider if you often feel depressed.  Tell your health care provider if you have ever been abused or do not feel safe at home. This information is not intended to replace advice given to you by your health care provider. Make sure you discuss any questions you have with your health care provider. Document Released: 04/11/2011 Document Revised: 03/03/2016 Document Reviewed: 06/30/2015 Elsevier Interactive Patient Education  [employer].

## 2017-07-12 NOTE — Telephone Encounter (Signed)
Patient has been added to shingrix waitlist per patient request

## 2017-07-12 NOTE — Progress Notes (Signed)
   Subjective:    Patient ID: Brianna Gardner, female    DOB: 1956-02-05, 61 y.o.   MRN: [id number]  HPI The patient is a 61 YO female coming in for wellness. Gynecologist in May for pap and mammogram.   PMH, [facility], social history reviewed and updated.   Review of Systems  Constitutional: Negative.   HENT: Negative.   Eyes: Negative.   Respiratory: Negative for cough, chest tightness and shortness of breath.   Cardiovascular: Negative for chest pain, palpitations and leg swelling.  Gastrointestinal: Negative for abdominal distention, abdominal pain, constipation, diarrhea, nausea and vomiting.  Musculoskeletal: Negative.   Skin: Negative.   Neurological: Negative.   Psychiatric/Behavioral: Negative.       Objective:   Physical Exam  Constitutional: She is oriented to person, place, and time. She appears well-developed and well-nourished.  HENT:  Head: Normocephalic and atraumatic.  Eyes: EOM are normal.  Neck: Normal range of motion.  Cardiovascular: Normal rate and regular rhythm.   Pulmonary/Chest: Effort normal and breath sounds normal. No respiratory distress. She has no wheezes. She has no rales.  Abdominal: Soft. Bowel sounds are normal. She exhibits no distension. There is no tenderness. There is no rebound.  Musculoskeletal: She exhibits no edema.  Neurological: She is alert and oriented to person, place, and time. Coordination normal.  Skin: Skin is warm and dry.  Psychiatric: She has a normal mood and affect.   Vitals:   07/12/17 0801  BP: 112/70  Pulse: (!) 59  Temp: 98 F (36.7 C)  TempSrc: Oral  SpO2: 98%  Weight: 131 lb (59.4 kg)  Height: 5\' 7"  (1.702 m)      Assessment & Plan:  Flu shot given at visit

## 2017-07-14 NOTE — Assessment & Plan Note (Signed)
Just started on fosamax with gynecology in the last 6 months.

## 2017-07-14 NOTE — Assessment & Plan Note (Signed)
Pap smear and mammogram up to date with gynecology. Counseled about shingrix. Flu shot given today. Tetanus up to date. Counseled about sun safety and mole surveillance. Given screening recommendations.

## 2017-08-02 ENCOUNTER — Other Ambulatory Visit: Payer: Self-pay | Admitting: Rheumatology

## 2017-08-02 NOTE — Telephone Encounter (Signed)
Last Visit: 02/07/17 Next Visit was due August 2018. Message sent to front to schedule patient.  Labs: 07/12/17 cbc/cmp WNL  Okay to refill per Dr. Estanislado Pandy

## 2017-08-03 ENCOUNTER — Telehealth: Payer: Self-pay

## 2017-08-03 NOTE — Telephone Encounter (Signed)
Left voicemail asking patient to call back to schedule nurse visit to get first shingrix vaccine, let Shakur Lembo know if appt is made so that both vaccines can be labeled and placed in [facility] refrigerator

## 2017-08-04 ENCOUNTER — Ambulatory Visit (INDEPENDENT_AMBULATORY_CARE_PROVIDER_SITE_OTHER): Payer: BC Managed Care – PPO | Admitting: *Deleted

## 2017-08-04 DIAGNOSIS — Z23 Encounter for immunization: Secondary | ICD-10-CM | POA: Diagnosis not present

## 2017-08-08 ENCOUNTER — Encounter: Payer: Self-pay | Admitting: Rheumatology

## 2017-08-08 ENCOUNTER — Ambulatory Visit (INDEPENDENT_AMBULATORY_CARE_PROVIDER_SITE_OTHER): Payer: BC Managed Care – PPO | Admitting: Rheumatology

## 2017-08-08 VITALS — BP 83/54 | HR 74 | Resp 14 | Ht 66.5 in | Wt 133.0 lb

## 2017-08-08 DIAGNOSIS — M19071 Primary osteoarthritis, right ankle and foot: Secondary | ICD-10-CM

## 2017-08-08 DIAGNOSIS — M170 Bilateral primary osteoarthritis of knee: Secondary | ICD-10-CM | POA: Diagnosis not present

## 2017-08-08 DIAGNOSIS — M19042 Primary osteoarthritis, left hand: Secondary | ICD-10-CM | POA: Diagnosis not present

## 2017-08-08 DIAGNOSIS — M19041 Primary osteoarthritis, right hand: Secondary | ICD-10-CM | POA: Diagnosis not present

## 2017-08-08 DIAGNOSIS — M810 Age-related osteoporosis without current pathological fracture: Secondary | ICD-10-CM | POA: Diagnosis not present

## 2017-08-08 DIAGNOSIS — M19072 Primary osteoarthritis, left ankle and foot: Secondary | ICD-10-CM | POA: Diagnosis not present

## 2017-08-08 NOTE — Progress Notes (Signed)
Office Visit Note  Patient: Brianna Gardner             Date of Birth: Sep 26, 1956           MRN: [id number]             PCP: Hoyt Koch, MD Referring: Hoyt Koch, * Visit Date: 08/08/2017 Occupation: @GUAROCC @    Subjective:  Osteoarthritis (Doing good)   History of Present Illness: Brianna Gardner is a 61 y.o. female last seen Feb 07, 2017. Patient's bone density done on January 18, 2017 and patient had a T score is -3.3 of the left femoral area.  She was started on Fosamax 70 mg weekly.  She is tolerating the medication well. She is also doing weightbearing exercise, taking vitamin D 3, thousand IUs daily, and her serum calcium levels have been in the normal range on the last 2 blood draws. She comes today for follow-up visit. She has no complaint.   Activities of Daily Living:  Patient reports morning stiffness for 15 minutes.   Patient Denies nocturnal pain.  Difficulty dressing/grooming: Denies Difficulty climbing stairs: Denies Difficulty getting out of chair: Denies Difficulty using hands for taps, buttons, cutlery, and/or writing: Denies   No Rheumatology ROS completed.   PMFS History:  Patient Active Problem List   Diagnosis Date Noted  . Age-related osteoporosis without current pathological fracture 02/04/2017  . Primary osteoarthritis of both feet 01/24/2017  . Primary osteoarthritis of both knees 01/24/2017  . ANA positive 08/04/2016  . Routine general medical examination at a health care facility 12/18/2015  . Primary osteoarthritis of both hands   . Allergic rhinitis, cause unspecified     Past Medical History:  Diagnosis Date  . Allergic rhinitis, cause unspecified   . Osteoarthritis    hands, great toes B - follows with rheum  . Osteopenia 07/2009   DEXA 08/06/09: -1.9 fem    Family History  Problem Relation Age of Onset  . Arthritis Father   . Stroke Father 1  . Arthritis Mother   . Dementia Mother 60   mild-mod, in ALF, ambulatory  . Macular degeneration Mother 25       "dry"  . Stroke Paternal Grandmother   . Stroke Maternal Grandmother    Past Surgical History:  Procedure Laterality Date  . 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  No synovitis on exam  Investigation: No additional findings. Appointment on 07/12/2017  Component Date Value Ref Range Status  . Sodium 07/12/2017 136  135 - 145 mEq/L Final  . Potassium 07/12/2017 4.1  3.5 - 5.1 mEq/L Final  . Chloride 07/12/2017 100  96 - 112 mEq/L Final  . CO2 07/12/2017 28  19 - 32 mEq/L Final  . Glucose, Bld 07/12/2017 93  70 - 99 mg/dL Final  . BUN 07/12/2017 15  6 -  23 mg/dL Final  . Creatinine, Ser 07/12/2017 0.81  0.40 - 1.20 mg/dL Final  . Total Bilirubin 07/12/2017 0.7  0.2 - 1.2 mg/dL Final  . Alkaline Phosphatase 07/12/2017 60  39 - 117 U/L Final  . AST 07/12/2017 20  0 - 37 U/L Final  . ALT 07/12/2017 13  0 - 35 U/L Final  . Total Protein 07/12/2017 7.9  6.0 - 8.3 g/dL Final  . Albumin 07/12/2017 4.5  3.5 - 5.2 g/dL Final  . Calcium 07/12/2017 9.4  8.4 - 10.5 mg/dL Final  . GFR 07/12/2017 75.98  >60.00 mL/min Final  . Cholesterol 07/12/2017 255* 0 - 200 mg/dL Final   ATP III Classification       Desirable:  < 200 mg/dL               Borderline High:  200 - 239 mg/dL          High:  > = 240 mg/dL  . Triglycerides 07/12/2017 85.0  0.0 - 149.0 mg/dL Final   Normal:  <150 mg/dLBorderline  High:  150 - 199 mg/dL  . HDL 07/12/2017 73.90  >39.00 mg/dL Final  . VLDL 07/12/2017 17.0  0.0 - 40.0 mg/dL Final  . LDL Cholesterol 07/12/2017 164* 0 - 99 mg/dL Final  . Total CHOL/HDL Ratio 07/12/2017 3   Final                  Men          Women1/2 Average Risk     3.4          3.3Average Risk          5.0          4.42X Average Risk          9.6          7.13X Average Risk          15.0          11.0                      . NonHDL 07/12/2017 180.60   Final   NOTE:  Non-HDL goal should be 30 mg/dL higher than patient's LDL goal (i.e. LDL goal of < 70 mg/dL, would have non-HDL goal of < 100 mg/dL)  . WBC 07/12/2017 4.6  4.0 - 10.5 K/uL Final  . RBC 07/12/2017 4.40  3.87 - 5.11 Mil/uL Final  . Platelets 07/12/2017 283.0  150.0 - 400.0 K/uL Final  . Hemoglobin 07/12/2017 13.4  12.0 - 15.0 g/dL Final  . HCT 07/12/2017 39.1  36.0 - 46.0 % Final  . MCV 07/12/2017 88.9  78.0 - 100.0 fl Final  . MCHC 07/12/2017 34.2  30.0 - 36.0 g/dL Final  . RDW 07/12/2017 13.1  11.5 - 15.5 % Final  . TSH 07/12/2017 1.07  0.35 - 4.50 uIU/mL Final  Hospital Outpatient Visit on 02/20/2017  Component Date Value Ref Range Status  . Creatinine, Ser 02/20/2017 0.70  0.44 - 1.00 mg/dL Final  Office Visit on 02/07/2017  Component Date Value Ref Range Status  . PTH 02/07/2017 35  14 - 64 pg/mL Final  . Calcium 02/07/2017 9.2  8.6 - 10.4 mg/dL Final   Comment:   Interpretive Guide:                              Intact PTH  Calcium                              ----------               ------- Normal Parathyroid           Normal                   Normal Hypoparathyroidism           Low or Low Normal        Low Hyperparathyroidism      Primary                 Normal or High           High      Secondary               High                     Normal or Low      Tertiary                High                     High Non-Parathyroid   Hypercalcemia              Low or Low Normal        High   . Vit D,  25-Hydroxy 02/07/2017 36  30 - 100 ng/mL Final   Comment: Vitamin D Status           25-OH Vitamin D        Deficiency                <20 ng/mL        Insufficiency         20 - 29 ng/mL        Optimal             > or = 30 ng/mL   For 25-OH Vitamin D testing on patients on D2-supplementation and patients for whom quantitation of D2 and D3 fractions is required, the QuestAssureD 25-OH VIT D, (D2,D3), LC/MS/MS is recommended: order code [id number] (patients > 2 yrs).   . Magnesium 02/07/2017 2.2  1.5 - 2.5 mg/dL Final  . TSH 02/07/2017 1.34  mIU/L Final   Comment:   Reference Range   > or = 20 Years  0.40-4.50   Pregnancy Range First trimester  0.26-2.66 Second trimester 0.55-2.73 Third trimester  0.43-2.91     . Total Protein, Serum Electrophores* 02/07/2017 7.3  6.1 - 8.1 g/dL Final  . Albumin ELP 02/07/2017 4.4  3.8 - 4.8 g/dL Final  . Alpha-1-Globulin 02/07/2017 0.2  0.2 - 0.3 g/dL Final  . Alpha-2-Globulin 02/07/2017 0.7  0.5 - 0.9 g/dL Final  . Beta Globulin 02/07/2017 0.4  0.4 - 0.6 g/dL Final  . Beta 2 02/07/2017 0.3  0.2 - 0.5 g/dL Final  . Gamma Globulin 02/07/2017 1.4  0.8 - 1.7 g/dL Final  . Abnormal Protein Band1 02/07/2017 0.5  g/dL Final  . SPE Interp. 02/07/2017 SEE NOTE   Final   Comment: Evaluation reveals a restricted band (M-spike) migrating in the gamma globulin region. Consider immunofixation analysis if indicated. Reflex testing will be performed as indicated. Reviewed by Francis Gaines Mammarappallil MD (Electronic Signature on File)   . Abnormal Protein Band2 02/07/2017 NOT  DET  g/dL Final  . Abnormal Protein Band3 02/07/2017 NOT DET  g/dL Final  . Sodium 02/07/2017 137  135 - 146 mmol/L Final  . Potassium 02/07/2017 4.0  3.5 - 5.3 mmol/L Final  . Chloride 02/07/2017 103  98 - 110 mmol/L Final  . CO2 02/07/2017 24  20 - 31 mmol/L Final  . Glucose, Bld 02/07/2017 81  65 - 99 mg/dL Final  . BUN 02/07/2017 18  7 - 25 mg/dL Final  . Creat 02/07/2017 0.79  0.50  - 0.99 mg/dL Final   Comment:   For patients > or = 62 years of age: The upper reference limit for Creatinine is approximately 13% higher for people identified as African-American.     . Total Bilirubin 02/07/2017 0.5  0.2 - 1.2 mg/dL Final  . Alkaline Phosphatase 02/07/2017 69  33 - 130 U/L Final  . AST 02/07/2017 21  10 - 35 U/L Final  . ALT 02/07/2017 15  6 - 29 U/L Final  . Total Protein 02/07/2017 7.3  6.1 - 8.1 g/dL Final  . Albumin 02/07/2017 4.3  3.6 - 5.1 g/dL Final  . Calcium 02/07/2017 9.2  8.6 - 10.4 mg/dL Final  . GFR, Est African American 02/07/2017 >89  >=60 mL/min Final  . GFR, Est Non African American 02/07/2017 80  >=60 mL/min Final  . Immunofix Electr Int 02/07/2017 SEE NOTE   Final   Comment: IgG lambda monoclonal band present. Reviewed by Francis Gaines Mammarappallil MD (Electronic Signature on File)      Imaging: No results found.  Speciality Comments: No specialty comments available.    Procedures:  No procedures performed Allergies: Demerol; Meperidine; and Penicillins   Assessment / Plan:     Visit Diagnoses: Age-related osteoporosis without current pathological fracture  Primary osteoarthritis of both feet  Primary osteoarthritis of both hands  Primary osteoarthritis of both knees   Plan: 1.:  Osteoporosis. On Fosamax. Doing well.  2.:  Bone density done April 2018. T score is -3.3. Please see report for full details.  3.:  Patient is doing weightbearing exercise, taking vitamin D3 2000 IUs daily Serum calcium level is within normal limits.  4.:  History of OA of bilateral feet, hands, knees.  #5: Return to clinic in 5 months  #6: Patient has adequate amount of Fosamax at this time (just was refill August 02, 2017) and will not need a refill for at least another 5-6 months.;  Doing well.  No complaint.   Orders: No orders of the defined types were placed in this encounter.  No orders of the defined types were placed in this  encounter.   Face-to-face time spent with patient was 30 minutes. 50% of time was spent in counseling and coordination of care.  Follow-Up Instructions: Return in about 5 months (around 01/06/2018) for OSTEOPROSIS,fosamax 70mg  wkly,.   Eliezer Lofts, PA-C  Note - This record has been created using [employer].  Chart creation errors have been sought, but may not always  have been located. Such creation errors do not reflect on  the standard of medical care.

## 2017-10-20 ENCOUNTER — Ambulatory Visit (INDEPENDENT_AMBULATORY_CARE_PROVIDER_SITE_OTHER): Payer: BC Managed Care – PPO

## 2017-10-20 DIAGNOSIS — Z299 Encounter for prophylactic measures, unspecified: Secondary | ICD-10-CM

## 2017-11-17 IMAGING — MR MR FEMUR*L* WO/W CM
4 of 8 series · 19 of 40 positions shown · IV contrast (Y)
Comparison: None.

CLINICAL DATA: Abnormal x-ray with distal femoral bone island.

EXAM:
MR OF THE LEFT LOWER EXTREMITY WITHOUT AND WITH CONTRAST
TECHNIQUE: Multiplanar, multisequence MR imaging of the left femur was
performed both before and after administration of intravenous
contrast.
CONTRAST:  12 mL MultiHance

[Series 3: T1 · axial · 7.0mm · 0.72mm/px · z∈[-200,+241]mm · 5 of 47 slices shown]
[im 1/47]
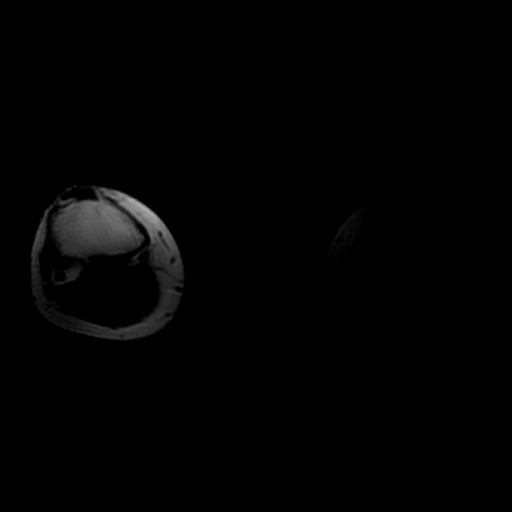
[im 10/47]
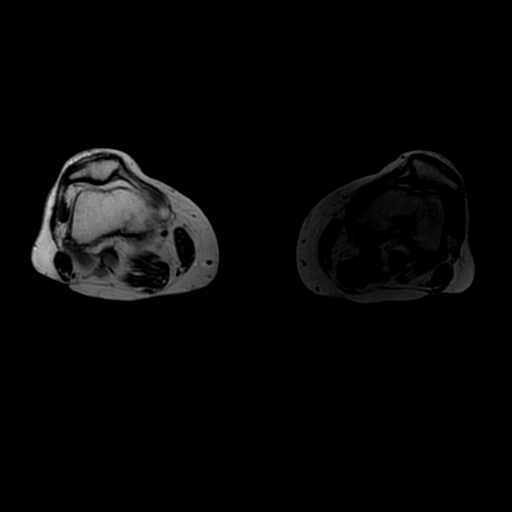
[im 19/47]
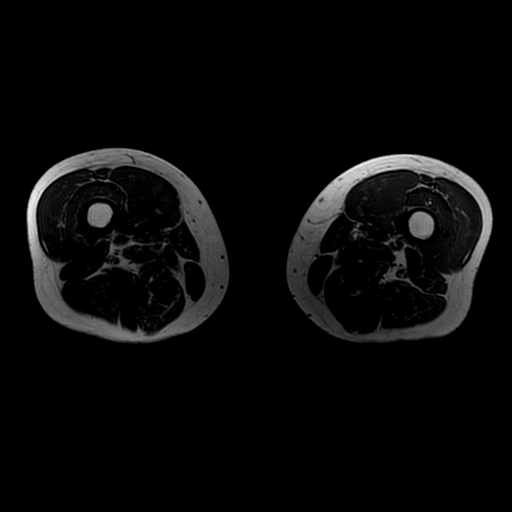
[im 28/47]
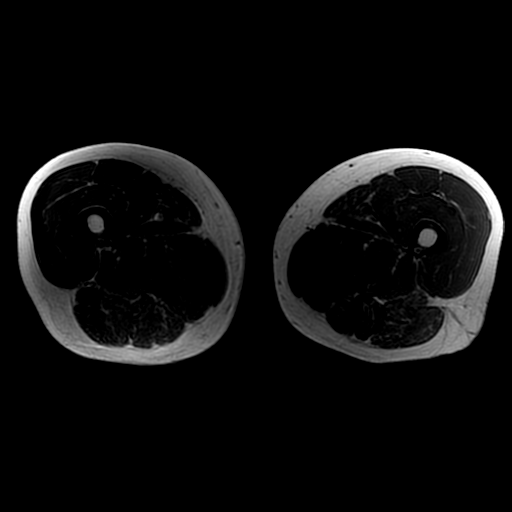
[im 47/47]
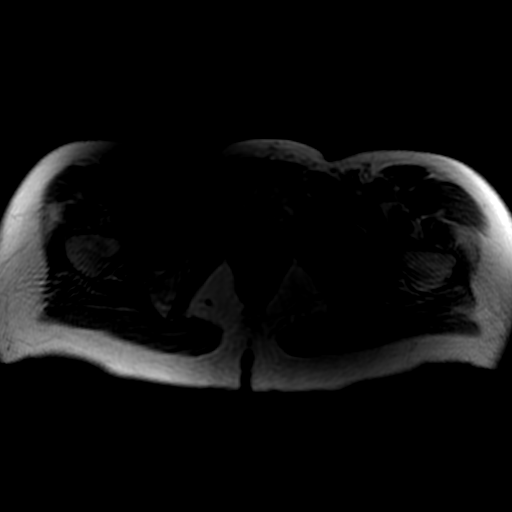

[Series 4: T2 fat-sat · axial · 7.0mm · 0.72mm/px · z∈[-200,+241]mm · 7 of 50 slices shown (1 of 2)]
[im 1/50]
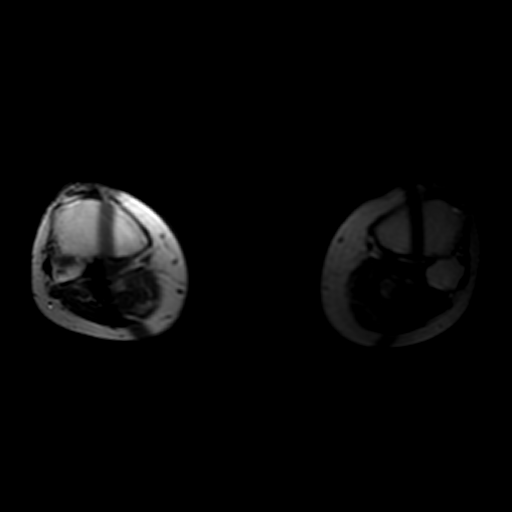
[im 9/50]
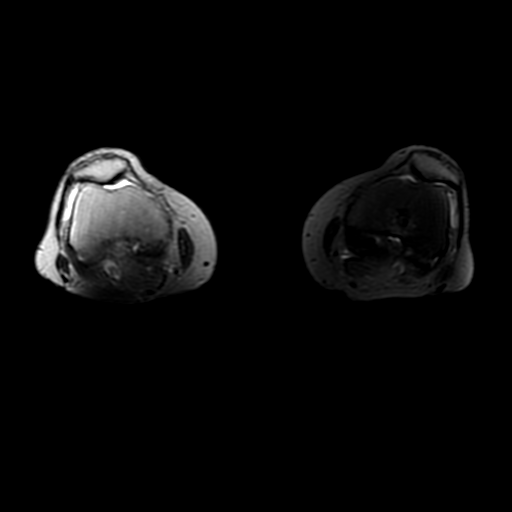
[im 17/50]
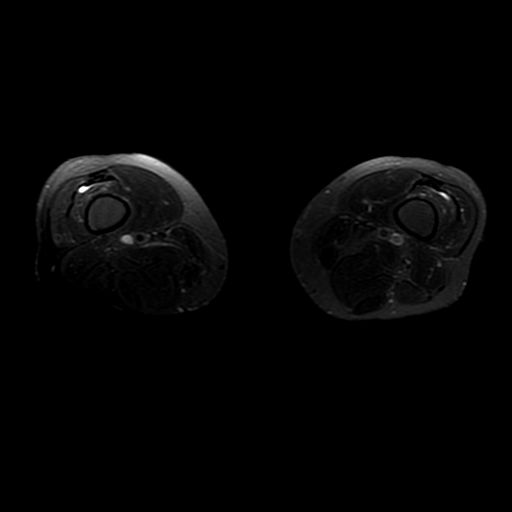
[im 25/50]
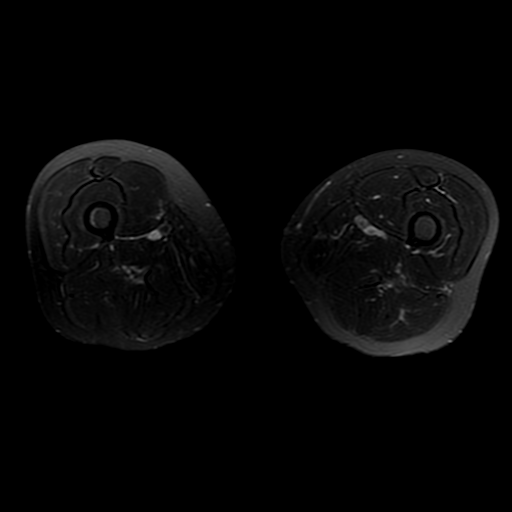
[im 33/50]
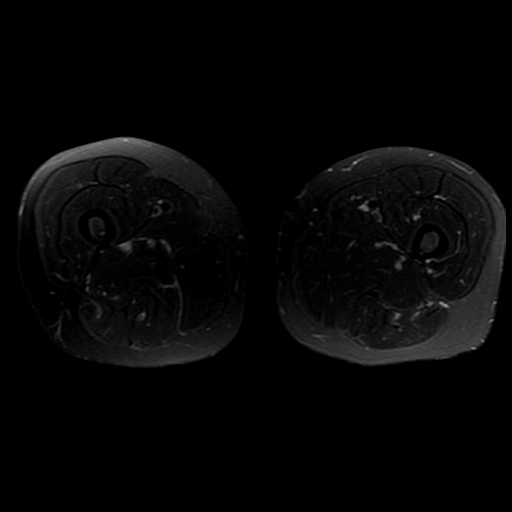
[im 41/50]
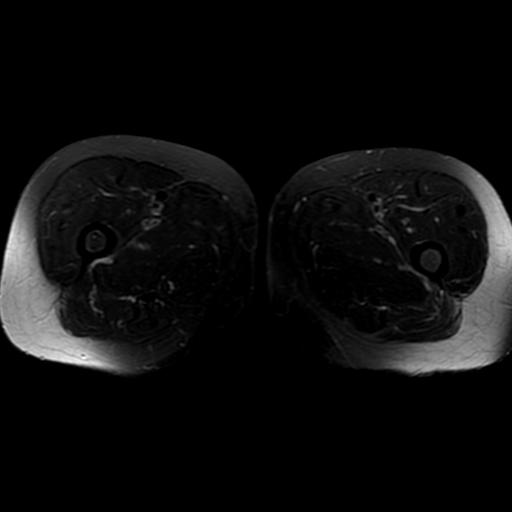
[im 50/50]
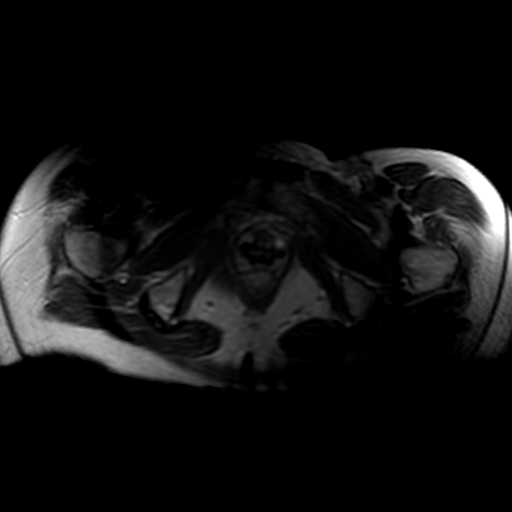

[Series 5: T1 fat-sat · axial · 7.0mm · 0.72mm/px · z∈[-110,+241]mm · 3 of 49 slices shown]
[im 10/49]
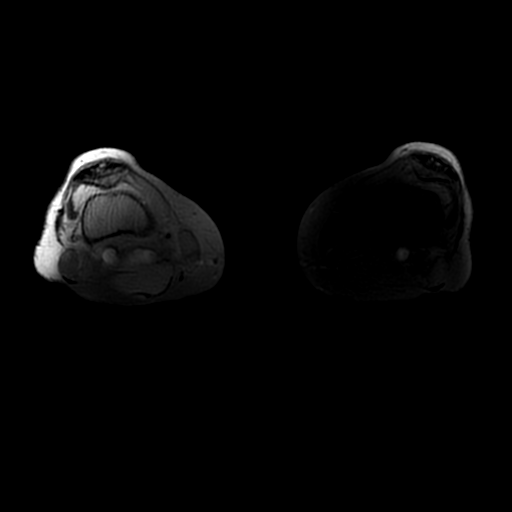
[im 29/49]
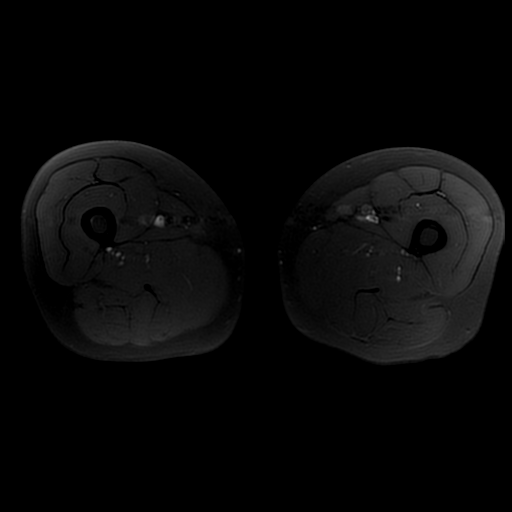
[im 49/49]
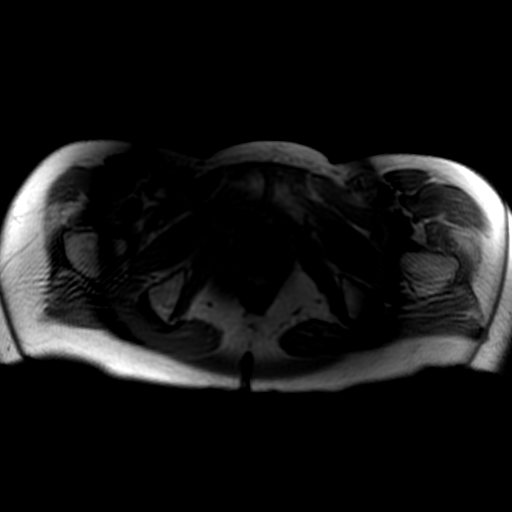

[Series 7: T2 fat-sat · coronal · 4.0mm · 0.84mm/px · 4 of 28 slices shown (2 of 2)]
[im 1/28]
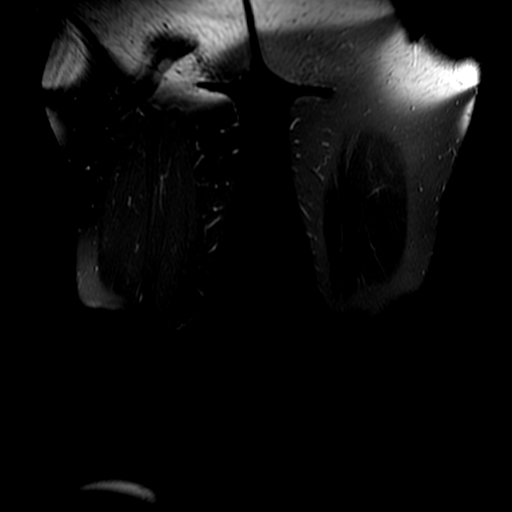
[im 10/28]
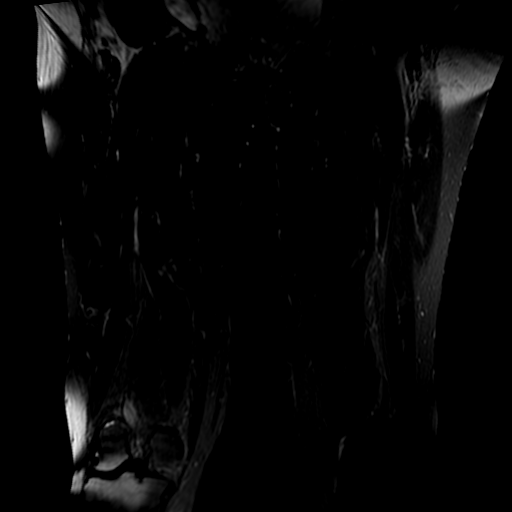
[im 19/28]
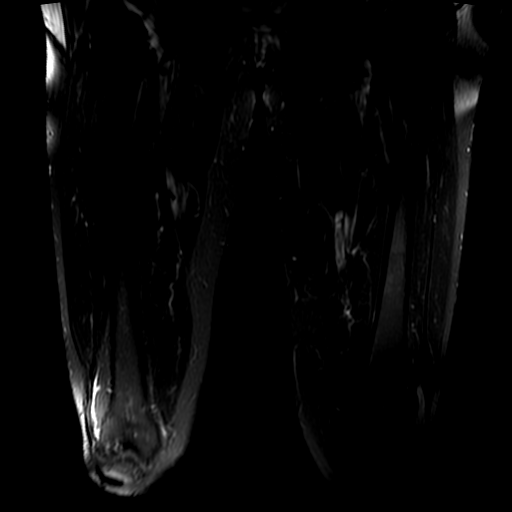
[im 28/28]
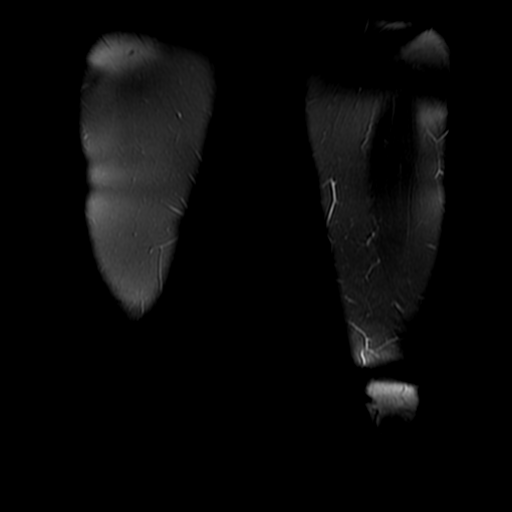

[19 of 40 positions shown; findings below may reference images not displayed]

FINDINGS: Bones/Joint/Cartilage

1.8 x 2 x 2.9 cm stippled low signal bone lesion in the distal
femoral metaphysis posteriorly. No endosteal scalloping, cortical
destruction, periosteal reaction or associated soft tissue mass.
Mild peripheral heterogeneous enhancement.

No acute fracture or dislocation. Normal alignment. Small joint
effusion.

High-grade partial-thickness cartilage loss with areas of
full-thickness cartilage loss of the left lateral patellofemoral
compartment. Full-thickness cartilage loss of the left lateral
femorotibial compartment.

Small Baker cyst.

Ligaments

Collateral ligaments are intact.  ACL and PCL are intact.

Muscles and Tendons
The muscles are normal. No muscle atrophy. No muscle edema. No
intramuscular fluid collection or hematoma.

Soft tissue
No fluid collection or hematoma.  No soft tissue mass.
IMPRESSION: 1. 1.8 x 2 x 2.9 cm bone lesion in the distal left femoral
metaphysis most consistent with a benign chondroid lesion such as an
enchondroma. No aggressive features.
2. Cartilage abnormalities of the left patellofemoral compartment
and lateral femorotibial compartment as described above, most
consistent with osteoarthritis.
3. Small Baker cyst.
4. Small joint effusion.

## 2018-01-10 ENCOUNTER — Other Ambulatory Visit: Payer: Self-pay | Admitting: Rheumatology

## 2018-01-15 ENCOUNTER — Telehealth: Payer: Self-pay | Admitting: Rheumatology

## 2018-01-15 NOTE — Telephone Encounter (Signed)
Patient returning your call.

## 2018-01-16 ENCOUNTER — Telehealth: Payer: Self-pay | Admitting: Rheumatology

## 2018-01-16 NOTE — Telephone Encounter (Signed)
Patient called requesting prescription refill of Alendronate.  Patient's pharmacy is [facility] [address] in [place].

## 2018-01-17 NOTE — Telephone Encounter (Signed)
Patient due for updated labs and a follow up appointment. Patient has been scheduled for a follow up appointment on 01/25/18 and will have labs at that appointment. Patient has enough Fosamax until her appointment next week. We can refill her prescription at that time.

## 2018-01-18 NOTE — Progress Notes (Signed)
Office Visit Note  Patient: Brianna Gardner             Date of Birth: [birth year]           MRN: [id number]             PCP: Hoyt Koch, MD Referring: Hoyt Koch, * Visit Date: 01/25/2018 Occupation: @GUAROCC @    Subjective:  Medication monitoring    History of Present Illness: Brianna Gardner is a 62 y.o. female with history of osteoporosis and osteoarthritis.  Patient states that she continues to take Fosamax 70 mg once a week.  She denies any side effects of Fosamax.  She also takes calcium and vitamin D on a daily basis. She states that she has occasional discomfort in her bilateral hands and bilateral knees due to osteoarthritis.  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  She denies any joint swelling or joint stiffness at this time.  She states that her feet cause occasional discomfort but she wears supportive shoes which help.    Activities of Daily Living:  Patient reports morning stiffness for 0 minutes.   Patient Denies nocturnal pain.  Difficulty dressing/grooming: Denies Difficulty climbing stairs: Denies Difficulty getting out of chair: Denies Difficulty using hands for taps, buttons, cutlery, and/or writing: Denies   Review of Systems  Constitutional: Negative for fatigue.  HENT: Negative for mouth sores, trouble swallowing, trouble swallowing, mouth dryness and nose dryness.   Eyes: Negative for pain, visual disturbance and dryness.  Respiratory: Negative for cough, hemoptysis, shortness of breath and difficulty breathing.   Cardiovascular: Negative for chest pain, palpitations, hypertension and swelling in legs/feet.  Gastrointestinal: Negative for blood in stool, constipation and diarrhea.  Endocrine: Negative for increased urination.  Genitourinary: Negative for painful urination.  Musculoskeletal: Positive for arthralgias and joint pain. Negative for joint swelling, myalgias,  muscle weakness, morning stiffness, muscle tenderness and myalgias.  Skin: Negative for color change, pallor, rash, hair loss, nodules/bumps, skin tightness, ulcers and sensitivity to sunlight.  Allergic/Immunologic: Negative for susceptible to infections.  Neurological: Negative for dizziness, numbness, headaches and weakness.  Hematological: Negative for swollen glands.  Psychiatric/Behavioral: Negative for depressed mood and sleep disturbance. The patient is not nervous/anxious.     PMFS History:  Patient Active Problem List   Diagnosis Date Noted  . Age-related osteoporosis without current pathological fracture 02/04/2017  . Primary osteoarthritis of both feet 01/24/2017  . Primary osteoarthritis of both knees 01/24/2017  . ANA positive 08/04/2016  . Routine general medical examination at a health care facility 12/18/2015  . Primary osteoarthritis of both hands   . Allergic rhinitis, cause unspecified     Past Medical History:  Diagnosis Date  . Allergic rhinitis, cause unspecified   . Osteoarthritis    hands, great toes B - follows with rheum  . Osteopenia 07/2009   DEXA 08/06/09: -1.9 fem    Family History  Problem Relation Age of Onset  . Arthritis Father   . Stroke Father 6  . Arthritis Mother   . Dementia Mother 81       mild-mod, in ALF, ambulatory  . Macular degeneration Mother 21       "dry"  . Stroke Paternal Grandmother   . Stroke Maternal Grandmother   . Arthritis Sister   . Arthritis Brother   . Arthritis Sister    Past Surgical History:  Procedure Laterality Date  . 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 She appears well-developed and well-nourished.  HENT:  Head: Normocephalic and atraumatic.  Eyes: Conjunctivae and EOM are normal.  Neck: Normal range of motion.  Cardiovascular: Normal rate, regular rhythm, normal heart sounds and intact distal pulses.  Pulmonary/Chest: Effort normal and breath sounds normal.  Abdominal: Soft. Bowel sounds are normal.  Lymphadenopathy:    She has no cervical adenopathy.  Neurological: She is alert and oriented to person, place, and time.  Skin: Skin is warm and dry. Capillary refill takes less than 2 seconds.  Psychiatric: She has a normal mood and affect. Her behavior is normal.  Nursing note and vitals reviewed.    Musculoskeletal Exam: C-spine, thoracic spine, lumbar spine good range of motion.  No midline spinal tenderness.  No SI joint tenderness.  Shoulder joints, elbow joints, wrist joints, MCPs, PIPs, DIPs good range of motion with no synovitis.  She has PIP and DIP synovial thickening consistent with osteoarthritis.  She has subluxation of several DIP joints.  She has synovial thickening of bilateral CMC joints.  Hip joints, knee joints, ankle joints, MTPs, PIPs, and DIPs good ROM with no synovitis.  No warmth or effusion of knee joints.  She has bilateral knee crepitus.  No tenderness of trochanteric bursa.    CDAI Exam: No CDAI exam completed.    Investigation: No additional findings. CBC Latest Ref Rng & Units 07/12/2017 12/18/2015 06/24/2014  WBC 4.0 - 10.5 K/uL 4.6 4.8 4.4  Hemoglobin 12.0 - 15.0 g/dL 13.4 13.5 14.0  Hematocrit 36.0 - 46.0 % 39.1 39.9 40.4  Platelets 150.0 - 400.0 K/uL 283.0 251.0 271.0   CMP Latest Ref Rng & Units 07/12/2017 02/20/2017 02/07/2017  Glucose 70 - 99 mg/dL 93 - -  BUN 6 - 23 mg/dL 15 - -  Creatinine 0.40 - 1.20 mg/dL 0.81 0.70 -  Sodium 135 - 145 mEq/L 136 - -  Potassium 3.5 - 5.1 mEq/L 4.1 - -  Chloride 96 - 112 mEq/L 100 - -  CO2 19 - 32 mEq/L  28 - -  Calcium 8.4 - 10.5 mg/dL 9.4 - 9.2  Total Protein 6.0 - 8.3 g/dL 7.9 - -  Total Bilirubin 0.2 - 1.2 mg/dL 0.7 - -  Alkaline Phos 39 - 117 U/L 60 - -  AST 0 - 37 U/L 20 - -  ALT 0 - 35 U/L 13 - -    Imaging: No results found.  Speciality Comments: No specialty comments available.    Procedures:  No procedures performed Allergies: Demerol; Meperidine; and Penicillins   Assessment / Plan:     Visit Diagnoses: Age-related osteoporosis without current pathological fracture - DEXA 01/11/2017 T score -3.3 left femoral neck.  She continues to take Fosamax 70 mg once weekly.  She also takes vitamin D and calcium on a daily basis.  We are checking CBC, CMP, and vitamin D today. A refill of Fosamax was sent to the pharmacy. - Plan: CBC with Differential/Platelet, COMPLETE METABOLIC PANEL WITH GFR, VITAMIN D  25 Hydroxy (Vit-D Deficiency, Fractures)  Primary osteoarthritis of both hands: She has PIP and DIP synovial thickening consistent with osteoarthritis.  She has has subluxation of several DIP joints as well as CMC joint synovial thickening.  Joint protection and muscle strengthening were discussed.    Primary osteoarthritis of both knees: She has no warmth or effusion on exam.  She has bilateral knee crepitus.  She experiences occasional discomfort in bilateral knees. 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    Primary osteoarthritis of both feet:  She experiences occasional discomfort.  She wears proper fitting shoes.    ANA positive - 1:160, ENA -, C3 and C4 normal   Enostosis of femur    Orders: Orders Placed This Encounter  Procedures  . CBC with Differential/Platelet  . COMPLETE METABOLIC PANEL WITH GFR  . VITAMIN D 25 Hydroxy (Vit-D Deficiency, Fractures)   Meds ordered this encounter  Medications  . alendronate (FOSAMAX) 70 MG tablet    Sig: TAKE 1 TABLET BY MOUTH ONCE A WEEK. TAKE WITH A FULL GLASS OF WATER ON AN EMPTY STOMACH    Dispense:  12  tablet    Refill:  1     Follow-Up Instructions: Return in about 6 months (around 07/27/2018) for Osteoporosis, Osteoarthritis.   Ofilia Neas, PA-C I examined and evaluated the patient with Brianna Sams PA. The plan of care was discussed as noted above.  Bo Merino, MD Note - This record has been created using [employer].  Chart creation errors have been sought, but may not always  have been located. Such creation errors do not reflect on  the standard of medical care.

## 2018-01-24 LAB — HM MAMMOGRAPHY

## 2018-01-25 ENCOUNTER — Encounter: Payer: Self-pay | Admitting: Internal Medicine

## 2018-01-25 ENCOUNTER — Ambulatory Visit: Payer: BC Managed Care – PPO | Admitting: Physician Assistant

## 2018-01-25 ENCOUNTER — Encounter: Payer: Self-pay | Admitting: Physician Assistant

## 2018-01-25 VITALS — BP 100/65 | HR 67 | Resp 16 | Ht 66.5 in | Wt 134.0 lb

## 2018-01-25 DIAGNOSIS — R7689 Other specified abnormal immunological findings in serum: Secondary | ICD-10-CM

## 2018-01-25 DIAGNOSIS — M19071 Primary osteoarthritis, right ankle and foot: Secondary | ICD-10-CM

## 2018-01-25 DIAGNOSIS — M170 Bilateral primary osteoarthritis of knee: Secondary | ICD-10-CM

## 2018-01-25 DIAGNOSIS — M19072 Primary osteoarthritis, left ankle and foot: Secondary | ICD-10-CM | POA: Diagnosis not present

## 2018-01-25 DIAGNOSIS — M19041 Primary osteoarthritis, right hand: Secondary | ICD-10-CM

## 2018-01-25 DIAGNOSIS — R768 Other specified abnormal immunological findings in serum: Secondary | ICD-10-CM | POA: Diagnosis not present

## 2018-01-25 DIAGNOSIS — M898X5 Other specified disorders of bone, thigh: Secondary | ICD-10-CM

## 2018-01-25 DIAGNOSIS — M810 Age-related osteoporosis without current pathological fracture: Secondary | ICD-10-CM | POA: Diagnosis not present

## 2018-01-25 DIAGNOSIS — Q799 Congenital malformation of musculoskeletal system, unspecified: Secondary | ICD-10-CM

## 2018-01-25 DIAGNOSIS — M19042 Primary osteoarthritis, left hand: Secondary | ICD-10-CM

## 2018-01-25 MED ORDER — ALENDRONATE SODIUM 70 MG PO TABS: ORAL_TABLET | ORAL | 1 refills | Status: DC

## 2018-01-25 NOTE — Progress Notes (Signed)
Abstracted and sent to scan  

## 2018-01-26 LAB — CBC WITH DIFFERENTIAL/PLATELET
BASOS PCT: 1.1 %
Basophils Absolute: 63 cells/uL (ref 0–200)
EOS ABS: 160 {cells}/uL (ref 15–500)
Eosinophils Relative: 2.8 %
HCT: 35.8 % (ref 35.0–45.0)
Hemoglobin: 12.5 g/dL (ref 11.7–15.5)
Lymphs Abs: 2280 cells/uL (ref 850–3900)
MCH: 30.4 pg (ref 27.0–33.0)
MCHC: 34.9 g/dL (ref 32.0–36.0)
MCV: 87.1 fL (ref 80.0–100.0)
MPV: 9.5 fL (ref 7.5–12.5)
Monocytes Relative: 8.1 %
NEUTROS PCT: 48 %
Neutro Abs: 2736 cells/uL (ref 1500–7800)
PLATELETS: 265 10*3/uL (ref 140–400)
RBC: 4.11 10*6/uL (ref 3.80–5.10)
RDW: 12.7 % (ref 11.0–15.0)
TOTAL LYMPHOCYTE: 40 %
WBC: 5.7 10*3/uL (ref 3.8–10.8)
WBCMIX: 462 {cells}/uL (ref 200–950)

## 2018-01-26 LAB — COMPLETE METABOLIC PANEL WITH GFR
AG Ratio: 1.5 (calc) (ref 1.0–2.5)
ALKALINE PHOSPHATASE (APISO): 60 U/L (ref 33–130)
ALT: 13 U/L (ref 6–29)
AST: 23 U/L (ref 10–35)
Albumin: 4.3 g/dL (ref 3.6–5.1)
BUN: 25 mg/dL (ref 7–25)
CALCIUM: 9.3 mg/dL (ref 8.6–10.4)
CO2: 28 mmol/L (ref 20–32)
CREATININE: 0.92 mg/dL (ref 0.50–0.99)
Chloride: 103 mmol/L (ref 98–110)
GFR, EST NON AFRICAN AMERICAN: 66 mL/min/{1.73_m2} (ref >=60)
GFR, Est African American: 77 mL/min/{1.73_m2} (ref >=60)
GLOBULIN: 2.9 g/dL (ref 1.9–3.7)
Glucose, Bld: 94 mg/dL (ref 65–99)
Potassium: 4.3 mmol/L (ref 3.5–5.3)
SODIUM: 138 mmol/L (ref 135–146)
Total Bilirubin: 0.4 mg/dL (ref 0.2–1.2)
Total Protein: 7.2 g/dL (ref 6.1–8.1)

## 2018-01-26 LAB — VITAMIN D 25 HYDROXY (VIT D DEFICIENCY, FRACTURES): Vit D, 25-Hydroxy: 35 ng/mL (ref 30–100)

## 2018-01-29 NOTE — Progress Notes (Signed)
Labs are WNL.  Please recommend that she continue taking Vitamin D 1,000 units daily.

## 2018-07-02 ENCOUNTER — Other Ambulatory Visit: Payer: Self-pay | Admitting: Physician Assistant

## 2018-07-02 NOTE — Telephone Encounter (Signed)
Last Visit: 01/25/18 Next Visit due in October 2019. Message sent to the front to schedule patient.  Labs: 01/25/18 WNL  Okay to refill per Dr. Estanislado Pandy

## 2018-07-03 ENCOUNTER — Telehealth: Payer: Self-pay | Admitting: Rheumatology

## 2018-07-03 NOTE — Telephone Encounter (Signed)
Left message to advise patient prescription was sent to the pharmacy on 07/02/18.

## 2018-07-03 NOTE — Telephone Encounter (Signed)
Patient called requesting prescription refill of [facility] [address] in [place].  Patient states she only has one pill remaining.

## 2018-07-17 NOTE — Progress Notes (Signed)
Office Visit Note  Patient: Brianna Gardner             Date of Birth: 05-02-1956           MRN: [id number]             PCP: Hoyt Koch, MD Referring: Hoyt Koch, * Visit Date: 07/31/2018 Occupation: @GUAROCC @  Subjective:  Medication monitoring   History of Present Illness: Carinna Newhart is a 62 y.o. female with history of osteoporosis and osteoarthritis.  Patient is on Fosamax 70 mg by mouth once weekly for management of osteoporosis.  She has been taking calcium and vitamin D 1,000 units by mouth daily.  She has been tolerating Fosamax well.  She continues to have pain and intermittent swelling in bilateral hands.  The only joint that is bothering her today is her right third PIP joint.  She states that her bilateral knee joints are doing well overall.  She continues to exercise on a daily basis.  She denies any other joint pain or joint swelling at this time.    Activities of Daily Living:  Patient reports morning stiffness for 10 minutes.   Patient Denies nocturnal pain.  Difficulty dressing/grooming: Denies Difficulty climbing stairs: Denies Difficulty getting out of chair: Denies Difficulty using hands for taps, buttons, cutlery, and/or writing: Denies  Review of Systems  Constitutional: Negative for fatigue.  HENT: Negative for mouth sores, mouth dryness and nose dryness.   Eyes: Negative for pain, visual disturbance and dryness.  Respiratory: Negative for cough, hemoptysis, shortness of breath and difficulty breathing.   Cardiovascular: Negative for chest pain, palpitations, hypertension and swelling in legs/feet.  Gastrointestinal: Negative for blood in stool, constipation and diarrhea.  Endocrine: Negative for increased urination.  Genitourinary: Negative for difficulty urinating and painful urination.  Musculoskeletal: Positive for arthralgias, joint pain, joint swelling and morning stiffness. Negative for myalgias, muscle weakness, muscle  tenderness and myalgias.  Skin: Negative for color change, pallor, rash, hair loss, nodules/bumps, skin tightness, ulcers and sensitivity to sunlight.  Allergic/Immunologic: Negative for susceptible to infections.  Neurological: Negative for dizziness, numbness, headaches and weakness.  Hematological: Negative for bruising/bleeding tendency and swollen glands.  Psychiatric/Behavioral: Negative for depressed mood and sleep disturbance. The patient is not nervous/anxious.     PMFS History:  Patient Active Problem List   Diagnosis Date Noted  . Age-related osteoporosis without current pathological fracture 02/04/2017  . Primary osteoarthritis of both feet 01/24/2017  . Primary osteoarthritis of both knees 01/24/2017  . ANA positive 08/04/2016  . Routine general medical examination at a health care facility 12/18/2015  . Primary osteoarthritis of both hands   . Allergic rhinitis, cause unspecified     Past Medical History:  Diagnosis Date  . Allergic rhinitis, cause unspecified   . Osteoarthritis    hands, great toes B - follows with rheum  . Osteopenia 07/2009   DEXA 08/06/09: -1.9 fem    Family History  Problem Relation Age of Onset  . Arthritis Father   . Stroke Father 14  . Arthritis Mother   . Dementia Mother 2       mild-mod, in ALF, ambulatory  . Macular degeneration Mother 38       "dry"  . Stroke Paternal Grandmother   . Stroke Maternal Grandmother   . Arthritis Sister   . Arthritis Brother   . Arthritis Sister    Past Surgical History:  Procedure Laterality Date  . 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 She appears well-developed and well-nourished.  HENT:  Head: Normocephalic and atraumatic.  Eyes: Conjunctivae and EOM are normal.  Neck: Normal range of motion.  Cardiovascular: Normal rate, regular rhythm, normal heart sounds and intact distal pulses.  Pulmonary/Chest: Effort normal and breath sounds normal.  Abdominal: Soft. Bowel sounds are normal.  Lymphadenopathy:    She has no cervical adenopathy.  Neurological: She is alert and oriented to person, place, and time.  Skin: Skin is warm and dry. Capillary refill takes less than 2 seconds.  Psychiatric: She has a normal mood and affect. Her behavior is normal.  Nursing note and vitals reviewed.    Musculoskeletal Exam: C-spine, thoracic spine, lumbar spine good range of motion.  No midline spinal tenderness.  No SI joint tenderness.  Shoulder joints, elbow joints, wrist joints, MCPs, PIPs, DIPs good range of motion.  She has PIP and DIP synovial thickening consistent with zoster arthritis of bilateral hands.  She has bilateral CMC joint synovial thickening.  She has tenderness of bilateral CMC joints.  She has inflammation of her right third PIP joint.  Hip joints, knee joints, ankle joints, MTPs, PIPs, DIPs good range of motion with no synovitis.  No warmth or effusion bilateral knee joints.  Bilateral knee crepitus.  No tenderness or swelling of ankle joints.  No Achilles tenderness or plantar fasciitis.  CDAI Exam: CDAI Score: Not documented Patient Global Assessment: Not documented; Provider Global Assessment: Not documented Swollen: Not documented; Tender: Not documented Joint Exam   Not documented   There is currently no information documented on the homunculus. Go to the Rheumatology activity and complete the homunculus joint exam.  Investigation: No additional findings.  Imaging: No results found.  Recent Labs: Lab Results  Component Value Date   WBC 5.7  01/25/2018   HGB 12.5 01/25/2018   PLT 265 01/25/2018   NA 138 01/25/2018   K 4.3 01/25/2018   CL 103 01/25/2018   CO2 28 01/25/2018   GLUCOSE 94 01/25/2018   BUN 25 01/25/2018   CREATININE 0.92 01/25/2018   BILITOT 0.4 01/25/2018   ALKPHOS 60 07/12/2017   AST 23 01/25/2018   ALT 13 01/25/2018   PROT 7.2 01/25/2018   ALBUMIN 4.5 07/12/2017   CALCIUM 9.3 01/25/2018   GFRAA 77 01/25/2018    Procedures:  No procedures performed Allergies: Demerol; Meperidine; and Penicillins   Assessment / Plan:      Visit Diagnoses: Age-related osteoporosis without current pathological fracture - DEXA 01/11/2017 T score -3.3 left femoral neck.  She is on Fosamax 70mg  once weekly started on 02/07/17.  She is been tolerating Fosamax.  She takes vitamin D 1000 units by mouth daily as well as a calcium supplement.  She does not need any refills of Fosamax at this time.  She has no midline spinal tenderness.  She has not had any recent fractures.  She has not fallen recently.  She continues to exercise on a daily basis.  We  will check CBC, CMP, and vitamin D level today.  She will follow-up in the office in 6 months.  Her next DEXA scan will be in May 2020.- Plan: VITAMIN D 25 Hydroxy (Vit-D Deficiency, Fractures)  Primary osteoarthritis of both hands: She has PIP and DIP synovial thickening consistent with osteoarthritis of bilateral hands.  She has bilateral CMC joint synovial thickening.  She has tenderness and inflammation of the right third PIP and bilateral CMC joints.  Primary osteoarthritis of both knees: No warmth or effusion noted.  Bilateral knee crepitus.  She has occasional knee discomfort.    Primary osteoarthritis of both feet: She has osteoarthritic changes in bilateral feet.  She has no discomfort at this time.  She wears proper fitting shoes.    ANA positive - 1:160, ENA -, C3 and C4 normal.  She has no features of autoimmune disease at this time.   Enostosis of femur  Medication  monitoring encounter -CBC and CMP will be drawn today to monitor for drug toxicity.  Plan: COMPLETE METABOLIC PANEL WITH GFR, CBC with Differential/Platelet     Orders: Orders Placed This Encounter  Procedures  . COMPLETE METABOLIC PANEL WITH GFR  . CBC with Differential/Platelet  . VITAMIN D 25 Hydroxy (Vit-D Deficiency, Fractures)   No orders of the defined types were placed in this encounter.     Follow-Up Instructions: Return in about 6 months (around 01/30/2019) for Osteoporosis, Osteoarthritis.   Ofilia Neas, PA-C  Note - This record has been created using Dragon software.  Chart creation errors have been sought, but may not always  have been located. Such creation errors do not reflect on  the standard of medical care.

## 2018-07-31 ENCOUNTER — Ambulatory Visit: Payer: BC Managed Care – PPO | Admitting: Physician Assistant

## 2018-07-31 ENCOUNTER — Encounter: Payer: Self-pay | Admitting: Physician Assistant

## 2018-07-31 VITALS — BP 86/53 | HR 69 | Resp 14 | Ht 66.5 in | Wt 132.2 lb

## 2018-07-31 DIAGNOSIS — M19072 Primary osteoarthritis, left ankle and foot: Secondary | ICD-10-CM

## 2018-07-31 DIAGNOSIS — M810 Age-related osteoporosis without current pathological fracture: Secondary | ICD-10-CM | POA: Diagnosis not present

## 2018-07-31 DIAGNOSIS — R7689 Other specified abnormal immunological findings in serum: Secondary | ICD-10-CM

## 2018-07-31 DIAGNOSIS — M19041 Primary osteoarthritis, right hand: Secondary | ICD-10-CM | POA: Diagnosis not present

## 2018-07-31 DIAGNOSIS — R768 Other specified abnormal immunological findings in serum: Secondary | ICD-10-CM

## 2018-07-31 DIAGNOSIS — M19071 Primary osteoarthritis, right ankle and foot: Secondary | ICD-10-CM

## 2018-07-31 DIAGNOSIS — Z5181 Encounter for therapeutic drug level monitoring: Secondary | ICD-10-CM

## 2018-07-31 DIAGNOSIS — M898X5 Other specified disorders of bone, thigh: Secondary | ICD-10-CM

## 2018-07-31 DIAGNOSIS — M19042 Primary osteoarthritis, left hand: Secondary | ICD-10-CM

## 2018-07-31 DIAGNOSIS — M170 Bilateral primary osteoarthritis of knee: Secondary | ICD-10-CM

## 2018-07-31 DIAGNOSIS — Q799 Congenital malformation of musculoskeletal system, unspecified: Secondary | ICD-10-CM

## 2018-08-01 LAB — COMPLETE METABOLIC PANEL WITH GFR
AG RATIO: 1.6 (calc) (ref 1.0–2.5)
ALT: 16 U/L (ref 6–29)
AST: 22 U/L (ref 10–35)
Albumin: 4.4 g/dL (ref 3.6–5.1)
Alkaline phosphatase (APISO): 57 U/L (ref 33–130)
BUN: 18 mg/dL (ref 7–25)
CALCIUM: 9 mg/dL (ref 8.6–10.4)
CHLORIDE: 103 mmol/L (ref 98–110)
CO2: 30 mmol/L (ref 20–32)
Creat: 0.78 mg/dL (ref 0.50–0.99)
GFR, EST NON AFRICAN AMERICAN: 81 mL/min/{1.73_m2} (ref >=60)
GFR, Est African American: 94 mL/min/{1.73_m2} (ref >=60)
GLUCOSE: 78 mg/dL (ref 65–99)
Globulin: 2.8 g/dL (calc) (ref 1.9–3.7)
POTASSIUM: 4.1 mmol/L (ref 3.5–5.3)
Sodium: 138 mmol/L (ref 135–146)
Total Bilirubin: 0.4 mg/dL (ref 0.2–1.2)
Total Protein: 7.2 g/dL (ref 6.1–8.1)

## 2018-08-01 LAB — CBC WITH DIFFERENTIAL/PLATELET
BASOS ABS: 52 {cells}/uL (ref 0–200)
Basophils Relative: 1 %
Eosinophils Absolute: 151 cells/uL (ref 15–500)
Eosinophils Relative: 2.9 %
HEMATOCRIT: 36.9 % (ref 35.0–45.0)
HEMOGLOBIN: 12.6 g/dL (ref 11.7–15.5)
LYMPHS ABS: 2288 {cells}/uL (ref 850–3900)
MCH: 30.1 pg (ref 27.0–33.0)
MCHC: 34.1 g/dL (ref 32.0–36.0)
MCV: 88.1 fL (ref 80.0–100.0)
MPV: 9.4 fL (ref 7.5–12.5)
Monocytes Relative: 8.7 %
NEUTROS ABS: 2257 {cells}/uL (ref 1500–7800)
Neutrophils Relative %: 43.4 %
Platelets: 262 10*3/uL (ref 140–400)
RBC: 4.19 10*6/uL (ref 3.80–5.10)
RDW: 12.5 % (ref 11.0–15.0)
Total Lymphocyte: 44 %
WBC: 5.2 10*3/uL (ref 3.8–10.8)
WBCMIX: 452 {cells}/uL (ref 200–950)

## 2018-08-01 LAB — VITAMIN D 25 HYDROXY (VIT D DEFICIENCY, FRACTURES): Vit D, 25-Hydroxy: 36 ng/mL (ref 30–100)

## 2018-08-01 NOTE — Progress Notes (Signed)
CBC and CMP WNL. Vitamin D is 36. Please encourage patient to continue her current dose of vitamin D.

## 2018-09-18 ENCOUNTER — Other Ambulatory Visit: Payer: Self-pay | Admitting: Rheumatology

## 2018-09-18 NOTE — Telephone Encounter (Signed)
Last Visit: 07/31/18 Next Visit: 12/19/18 Labs: 07/31/18 CBC and CMP WNL.   Okay to refill per Dr. Estanislado Pandy

## 2018-12-18 ENCOUNTER — Other Ambulatory Visit: Payer: Self-pay | Admitting: Rheumatology

## 2018-12-18 NOTE — Telephone Encounter (Signed)
Last Visit: 07/31/18 Next Visit: 12/19/18 Labs: 07/31/18 CBC and CMP WNL.   Okay to refill per Dr. Estanislado Pandy

## 2018-12-19 ENCOUNTER — Other Ambulatory Visit: Payer: Self-pay

## 2018-12-19 ENCOUNTER — Ambulatory Visit (INDEPENDENT_AMBULATORY_CARE_PROVIDER_SITE_OTHER): Payer: BC Managed Care – PPO | Admitting: Internal Medicine

## 2018-12-19 ENCOUNTER — Encounter: Payer: Self-pay | Admitting: Internal Medicine

## 2018-12-19 ENCOUNTER — Other Ambulatory Visit (INDEPENDENT_AMBULATORY_CARE_PROVIDER_SITE_OTHER): Payer: BC Managed Care – PPO

## 2018-12-19 VITALS — BP 100/64 | HR 57 | Temp 98.2°F | Ht 66.5 in | Wt 131.0 lb

## 2018-12-19 DIAGNOSIS — M810 Age-related osteoporosis without current pathological fracture: Secondary | ICD-10-CM

## 2018-12-19 DIAGNOSIS — Z0000 Encounter for general adult medical examination without abnormal findings: Secondary | ICD-10-CM

## 2018-12-19 LAB — LIPID PANEL
CHOL/HDL RATIO: 3
Cholesterol: 250 mg/dL — ABNORMAL HIGH (ref 0–200)
HDL: 84.3 mg/dL (ref >39.00)
LDL CALC: 152 mg/dL — AB (ref 0–99)
NONHDL: 165.82
Triglycerides: 71 mg/dL (ref 0.0–149.0)
VLDL: 14.2 mg/dL (ref 0.0–40.0)

## 2018-12-19 NOTE — Progress Notes (Signed)
   Subjective:   Patient ID: Brianna Gardner, female    DOB: 07-10-1956, 63 y.o.   MRN: [id number]  HPI The patient is a 63 YO female coming in for physical.   PMH, [facility], social history reviewed and updated  Pap smear 2018 with gyn  Review of Systems  Constitutional: Negative.   HENT: Negative.   Eyes: Negative.   Respiratory: Negative for cough, chest tightness and shortness of breath.   Cardiovascular: Negative for chest pain, palpitations and leg swelling.  Gastrointestinal: Negative for abdominal distention, abdominal pain, constipation, diarrhea, nausea and vomiting.  Musculoskeletal: Negative.   Skin: Negative.   Neurological: Negative.   Psychiatric/Behavioral: Negative.     Objective:  Physical Exam Constitutional:      Appearance: She is well-developed.  HENT:     Head: Normocephalic and atraumatic.  Neck:     Musculoskeletal: Normal range of motion.  Cardiovascular:     Rate and Rhythm: Normal rate and regular rhythm.  Pulmonary:     Effort: Pulmonary effort is normal. No respiratory distress.     Breath sounds: Normal breath sounds. No wheezing or rales.  Abdominal:     General: Bowel sounds are normal. There is no distension.     Palpations: Abdomen is soft.     Tenderness: There is no abdominal tenderness. There is no rebound.  Skin:    General: Skin is warm and dry.  Neurological:     Mental Status: She is alert and oriented to person, place, and time.     Coordination: Coordination normal.     Vitals:   12/19/18 0816  BP: 100/64  Pulse: (!) 57  Temp: 98.2 F (36.8 C)  TempSrc: Oral  SpO2: 97%  Weight: 131 lb (59.4 kg)  Height: 5' 6.5" (1.689 m)    Assessment & Plan:

## 2018-12-19 NOTE — Patient Instructions (Signed)

## 2018-12-20 NOTE — Assessment & Plan Note (Signed)
Taking fosamax and due for DEXA soon to check on efficacy.

## 2018-12-20 NOTE — Assessment & Plan Note (Signed)
Flu shot up to date. Shingrix complete. Tetanus up to date. Colonoscopy up to date. Mammogram up to date, pap smear up to date with gyn and dexa up to date. Counseled about sun safety and mole surveillance. Counseled about the dangers of distracted driving. Given 10 year screening recommendations.

## 2019-03-12 ENCOUNTER — Telehealth: Payer: Self-pay | Admitting: Rheumatology

## 2019-03-12 ENCOUNTER — Other Ambulatory Visit: Payer: Self-pay | Admitting: Rheumatology

## 2019-03-12 NOTE — Telephone Encounter (Signed)
Attempted to contact patient and left message for patient to call the office.  

## 2019-03-12 NOTE — Telephone Encounter (Signed)
Last Visit: 07/31/18 Next Visit: due in March 2020. Message sent to the front to schedule patient. Labs: 10/22/19CBC and CMP WNL.  Okay to refill 30 day supply Fosamax?

## 2019-03-12 NOTE — Telephone Encounter (Signed)
Patient left a voicemail stating she has questions regarding her medication and  bone density scan.  Patient requested a return call.

## 2019-03-12 NOTE — Telephone Encounter (Signed)
Please schedule patient for a follow up appointment. Patient was due March 2020. Thanks!

## 2019-03-12 NOTE — Telephone Encounter (Signed)
Spoke with patient and scheduled a follow up visit. Patient advised per we will discuss her medication refill and bone density and labs. Patient verbalized understanding.

## 2019-03-12 NOTE — Telephone Encounter (Signed)
Will refill medication after the follow-up visit and labs.

## 2019-03-12 NOTE — Telephone Encounter (Signed)
LMOM to schedule follow-up appointment 

## 2019-03-19 NOTE — Progress Notes (Signed)
Office Visit Note  Patient: Brianna Gardner             Date of Birth: 1955/11/19           MRN: [id number]             PCP: Hoyt Koch, MD Referring: Hoyt Koch, * Visit Date: 04/02/2019 Occupation: @GUAROCC @  Subjective:  Discuss DEXA results   History of Present Illness: Brianna Gardner is a 63 y.o. female with history of osteoporosis and osteoarthritis.  She continues to take Fosamax 70 mg po once weekly.  She is taking calcium and vitamin D supplements. She had an updated DEXA on 03/20/19.  She denies any increased joint pain or joint swelling.  She continues to have intermittent pain in both hands, both knee joints, and both feet.  She denies any joint swelling or increased joint stiffness.     Activities of Daily Living:  Patient reports morning stiffness for 2  minutes.   Patient Denies nocturnal pain.  Difficulty dressing/grooming: Denies Difficulty climbing stairs: Denies Difficulty getting out of chair: Denies Difficulty using hands for taps, buttons, cutlery, and/or writing: Denies  Review of Systems  Constitutional: Negative for fatigue.  HENT: Negative for mouth sores, mouth dryness and nose dryness.   Eyes: Negative for pain, visual disturbance and dryness.  Respiratory: Negative for cough, hemoptysis, shortness of breath and difficulty breathing.   Cardiovascular: Negative for chest pain, palpitations, hypertension and swelling in legs/feet.  Gastrointestinal: Negative for blood in stool, constipation and diarrhea.  Endocrine: Negative for increased urination.  Genitourinary: Negative for painful urination.  Musculoskeletal: Positive for morning stiffness. Negative for arthralgias, joint pain, joint swelling, myalgias, muscle weakness, muscle tenderness and myalgias.  Skin: Negative for color change, pallor, rash, hair loss, nodules/bumps, skin tightness, ulcers and sensitivity to sunlight.  Allergic/Immunologic: Negative for susceptible  to infections.  Neurological: Negative for dizziness, numbness, headaches and weakness.  Hematological: Negative for swollen glands.  Psychiatric/Behavioral: Negative for depressed mood and sleep disturbance. The patient is not nervous/anxious.     PMFS History:  Patient Active Problem List   Diagnosis Date Noted  . Age-related osteoporosis without current pathological fracture 02/04/2017  . Primary osteoarthritis of both feet 01/24/2017  . Primary osteoarthritis of both knees 01/24/2017  . ANA positive 08/04/2016  . Routine general medical examination at a health care facility 12/18/2015  . Primary osteoarthritis of both hands   . Allergic rhinitis, cause unspecified     Past Medical History:  Diagnosis Date  . Allergic rhinitis, cause unspecified   . Osteoarthritis    hands, great toes B - follows with rheum  . Osteopenia 07/2009   DEXA 08/06/09: -1.9 fem    Family History  Problem Relation Age of Onset  . Arthritis Father   . Stroke Father 34  . Arthritis Mother   . Dementia Mother 54       mild-mod, in ALF, ambulatory  . Macular degeneration Mother 65       "dry"  . Stroke Paternal Grandmother   . Stroke Maternal Grandmother   . Arthritis Sister   . Arthritis Brother   . Arthritis Sister    Past Surgical History:  Procedure Laterality Date  . 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 Influenza Split  07/09/2013  . Influenza,inj,Quad PF,6+ Mos 07/12/2017  . Influenza-Unspecified 08/10/2015, 08/10/2018  . Pneumococcal Polysaccharide-23 10/10/2009  . Tdap 01/16/2012  . Zoster 12/18/2015  . Zoster Recombinat (Shingrix) 08/04/2017, 10/20/2017     Objective: Vital Signs: BP 102/69 (BP Location: Left Arm, Patient Position: Sitting, Cuff Size: Normal)   Pulse 66   Resp 13    Ht 5' 5.5" (1.664 m)   Wt 135 lb 3.2 oz (61.3 kg)   BMI 22.16 kg/m    Physical Exam Vitals signs and nursing note reviewed.  Constitutional:      Appearance: She is well-developed.  HENT:     Head: Normocephalic and atraumatic.  Eyes:     Conjunctiva/sclera: Conjunctivae normal.  Neck:     Musculoskeletal: Normal range of motion.  Cardiovascular:     Rate and Rhythm: Normal rate and regular rhythm.     Heart sounds: Normal heart sounds.  Pulmonary:     Effort: Pulmonary effort is normal.     Breath sounds: Normal breath sounds.  Abdominal:     General: Bowel sounds are normal.     Palpations: Abdomen is soft.  Lymphadenopathy:     Cervical: No cervical adenopathy.  Skin:    General: Skin is warm and dry.     Capillary Refill: Capillary refill takes less than 2 seconds.  Neurological:     Mental Status: She is alert and oriented to person, place, and time.  Psychiatric:        Behavior: Behavior normal.      Musculoskeletal Exam: C-spine good range of motion.  Thoracic and lumbar spine good range of motion.  Shoulder joints elbow joints with good range of motion.  She has bilateral CMC prominence.  She has PIP and DIP thickening with some inflammation in her PIP joints.  DIP thickening was noted.  Hip joints, knee joints, ankles MTPs PIPs with good range of motion with no synovitis.  CDAI Exam: CDAI Score: - Patient Global: -; Provider Global: - Swollen: -; Tender: - Joint Exam   No joint exam has been documented for this visit   There is currently no information documented on the homunculus. Go to the Rheumatology activity and complete the homunculus joint exam.  Investigation: No additional findings.  Imaging: No results found.  Recent Labs: Lab Results  Component Value Date   WBC 5.2 07/31/2018   HGB 12.6 07/31/2018   PLT 262 07/31/2018   NA 138 07/31/2018   K 4.1 07/31/2018   CL 103 07/31/2018   CO2 30 07/31/2018   GLUCOSE 78 07/31/2018   BUN 18  07/31/2018   CREATININE 0.78 07/31/2018   BILITOT 0.4 07/31/2018   ALKPHOS 60 07/12/2017   AST 22 07/31/2018   ALT 16 07/31/2018   PROT 7.2 07/31/2018   ALBUMIN 4.5 07/12/2017   CALCIUM 9.0 07/31/2018   GFRAA 94 07/31/2018    Speciality Comments: No specialty comments available.  Procedures:  No procedures performed Allergies: Demerol, Meperidine, and Penicillins   Assessment / Plan:     Visit Diagnoses: Age-related osteoporosis without current pathological fracture -  DEXA 01/11/2017 BMD 0.482 T score -3.3 left femoral neck. DEXA on 03/20/19 BMD 0.494 T-score -3.2 Left femoral neck. She is on Fosamax 70 mg 1 tablet by mouth once weekly, which she started on 02/07/17.  She has not had any recent falls or fractures. We discussed her recent DEXA results.  We discussed switching her to yearly Reclast IV infusions for 2 years.  We discussed  the indications, contraindications, and potential side effects of Reclast.  All questions were addressed.  She will require updated lab work prior to her infusion.  She will follow up for a routine visit in 6 months.   Primary osteoarthritis of both hands -She has PIP and DIP synovial thickening consistent with osteoarthritis of both hands.  She has complete fist formation bilaterally.  Joint protection and muscle strengthening were discussed.   Primary osteoarthritis of both knees: She has no warmth or effusion.  She has good ROM with no discomfort at this time.   Primary osteoarthritis of both feet: She has intermittent pain in both feet.  No joint swelling.  She wears proper fitting shoes.   ANA positive - 1:160, ENA -, C3 and C4 normal.  She has no features of autoimmune disease at this time.   Enostosis of femur    Orders: Orders Placed This Encounter  Procedures  . CBC with Differential/Platelet  . COMPLETE METABOLIC PANEL WITH GFR  . VITAMIN D 25 Hydroxy (Vit-D Deficiency, Fractures)   No orders of the defined types were placed in this  encounter.   Face-to-face time spent with patient was 30 minutes. Greater than 50% of time was spent in counseling and coordination of care.  Follow-Up Instructions: Return in about 6 months (around 10/02/2019) for Osteoporosis, Osteoarthritis.   Ofilia Neas, PA-C   I examined and evaluated the patient with Hazel Sams PA.  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  We had detailed discussion regarding the bone density findings.  Based on her results we will switch her from Fosamax to Reclast.  Indications side effects contraindications of Reclast were discussed at length.  She wants to proceed with Reclast.  We will get labs prior to the Reclast infusion.  The plan of care was discussed as noted above.  Ofilia Neas, PA-C  Note - This record has been created using Dragon software.  Chart creation errors have been sought, but may not always  have been located. Such creation errors do not reflect on  the standard of medical care.

## 2019-03-20 LAB — HM DEXA SCAN: HM Dexa Scan: -3.2

## 2019-04-01 ENCOUNTER — Encounter: Payer: Self-pay | Admitting: Internal Medicine

## 2019-04-01 NOTE — Progress Notes (Signed)
Abstracted and sent to scan  

## 2019-04-02 ENCOUNTER — Other Ambulatory Visit: Payer: Self-pay

## 2019-04-02 ENCOUNTER — Ambulatory Visit (INDEPENDENT_AMBULATORY_CARE_PROVIDER_SITE_OTHER): Payer: BC Managed Care – PPO | Admitting: Physician Assistant

## 2019-04-02 ENCOUNTER — Telehealth: Payer: Self-pay | Admitting: Pharmacist

## 2019-04-02 ENCOUNTER — Encounter: Payer: Self-pay | Admitting: Physician Assistant

## 2019-04-02 VITALS — BP 102/69 | HR 66 | Resp 13 | Ht 65.5 in | Wt 135.2 lb

## 2019-04-02 DIAGNOSIS — M810 Age-related osteoporosis without current pathological fracture: Secondary | ICD-10-CM

## 2019-04-02 DIAGNOSIS — Z5181 Encounter for therapeutic drug level monitoring: Secondary | ICD-10-CM

## 2019-04-02 DIAGNOSIS — M19041 Primary osteoarthritis, right hand: Secondary | ICD-10-CM | POA: Diagnosis not present

## 2019-04-02 DIAGNOSIS — M19071 Primary osteoarthritis, right ankle and foot: Secondary | ICD-10-CM

## 2019-04-02 DIAGNOSIS — R768 Other specified abnormal immunological findings in serum: Secondary | ICD-10-CM

## 2019-04-02 DIAGNOSIS — Q799 Congenital malformation of musculoskeletal system, unspecified: Secondary | ICD-10-CM

## 2019-04-02 DIAGNOSIS — M170 Bilateral primary osteoarthritis of knee: Secondary | ICD-10-CM

## 2019-04-02 DIAGNOSIS — M19042 Primary osteoarthritis, left hand: Secondary | ICD-10-CM

## 2019-04-02 DIAGNOSIS — M19072 Primary osteoarthritis, left ankle and foot: Secondary | ICD-10-CM

## 2019-04-02 DIAGNOSIS — M898X5 Other specified disorders of bone, thigh: Secondary | ICD-10-CM

## 2019-04-02 NOTE — Telephone Encounter (Signed)
Patient agrees to trial of Reclast at this time.  Will apply for insurance approval for Reclast and update when we receive a response.

## 2019-04-02 NOTE — Progress Notes (Signed)
Pharmacy Note  Subjective: Patient presents today to the [facility] to see Dr. Estanislado Pandy.  Patient seen by pharmacist for counseling on bisphosphonate therapy for osteoporosis. She is on Fosamax 70 mg every 7 days along with calcium and vitamin D supplementation.  She had repeat DEXA on 04/01/19 which showed T-score of -3.2 and BMD 0.494 at left hip but -4% decrease in BMD at right hip. Previous DEXA on 01/11/2017 showed T score of  -3.3 at left hip.   Objective:   T-score: -3.2 at left hip  Lab Results  Component Value Date   VD25OH 36 07/31/2018   CMP     Component Value Date/Time   NA 138 07/31/2018 1621   K 4.1 07/31/2018 1621   CL 103 07/31/2018 1621   CO2 30 07/31/2018 1621   GLUCOSE 78 07/31/2018 1621   BUN 18 07/31/2018 1621   CREATININE 0.78 07/31/2018 1621   CALCIUM 9.0 07/31/2018 1621   PROT 7.2 07/31/2018 1621   ALBUMIN 4.5 07/12/2017 0833   AST 22 07/31/2018 1621   ALT 16 07/31/2018 1621   ALKPHOS 60 07/12/2017 0833   BILITOT 0.4 07/31/2018 1621   GFRNONAA 81 07/31/2018 1621   GFRAA 94 07/31/2018 1621    Counseled patient that Reclast is an IV bisphosphonate that reduces bone turnover by inhibiting osteoclasts that chew up bone.  Counseled patient on purpose, proper use, and adverse effects of Reclast.  Reviewed with patient that Reclast should be taken yearly.  Reviewed importance of taking calcium and vitamin D with bisphosphonate therapy. She already takes 1000 units of vitamin D and the daily recommended amount of calcium by eating 2 servings of dairy daily along with 500 mg supplement daily.  Provided patient with medication education material and answered all questions.  Reviewed adverse events of Reclast including risk of nausea & diarrhea, headache, and muscle & bone pain.  Reviewed rare adverse effect of osteonecrosis of the jaw and advised patient to alert her dentist that she is on Reclast prior to any major dental work.  Patient confirms she  does not have any major dental work scheduled at this time.  Patient agrees to trial of Reclast at this time.  Will apply for insurance approval for Reclast and update when we receive a response.  Advised that a CBC/CMP/Vitamin D level must be drawn within 30 days of infusion.  Patient verbalized understanding.  Future orders placed and she will return for labs once approved through insurance.  All questions encouraged and answered.  Instructed patient to call with any further questions or concerns.  Mariella Saa, PharmD, [place], CPP Rheumatology Clinical Pharmacist  04/02/2019 1:54 PM

## 2019-04-03 NOTE — Telephone Encounter (Signed)
Findings of benefits investigation for Reclast :  Insurance: [place] Plan  Phone:  [phone]  Plan is ACTIVE  Per Insurance, [place]- does NOT require precertification in the outpatient setting as long as the location is considered in-network. [place] is in-network with this plan. Patient's plan has a $1,250 deductible- in network services do not apply to deductible. Plan pays 80% and patient is responsible for 20%.  ** Was unable to get through to benefits team over the phone, Deductible and coinsurance info was provided by the [place] website directly. Pre-Cert info was provided by the Rep Malachy Mood on the Prior Authorization line.  12:42 PM Beatriz Chancellor, CPhT

## 2019-04-22 NOTE — Telephone Encounter (Signed)
Left voicemail to discuss approval and Reclast infusion.

## 2019-04-25 NOTE — Telephone Encounter (Signed)
Patient returned call.  Informed patient that she is eligible to receive Reclast through insurance and she would be responsible for 20& of co-insurance.  She is willing to proceed with Relcast.    She needs CMP/CMC and vitamin D prior to infusion.  Orders are in place.  She will come for labs next week.  Will place orders and give patient number to schedule infusion pending lab results.

## 2019-05-01 ENCOUNTER — Other Ambulatory Visit: Payer: Self-pay

## 2019-05-01 DIAGNOSIS — Z5181 Encounter for therapeutic drug level monitoring: Secondary | ICD-10-CM

## 2019-05-02 ENCOUNTER — Other Ambulatory Visit: Payer: Self-pay | Admitting: *Deleted

## 2019-05-02 DIAGNOSIS — M810 Age-related osteoporosis without current pathological fracture: Secondary | ICD-10-CM

## 2019-05-02 LAB — CBC WITH DIFFERENTIAL/PLATELET
Absolute Monocytes: 476 cells/uL (ref 200–950)
Basophils Absolute: 52 cells/uL (ref 0–200)
Basophils Relative: 0.9 %
Eosinophils Absolute: 209 cells/uL (ref 15–500)
Eosinophils Relative: 3.6 %
HCT: 37.2 % (ref 35.0–45.0)
Hemoglobin: 13 g/dL (ref 11.7–15.5)
Lymphs Abs: 2221 cells/uL (ref 850–3900)
MCH: 30.7 pg (ref 27.0–33.0)
MCHC: 34.9 g/dL (ref 32.0–36.0)
MCV: 87.7 fL (ref 80.0–100.0)
MPV: 9.5 fL (ref 7.5–12.5)
Monocytes Relative: 8.2 %
Neutro Abs: 2842 cells/uL (ref 1500–7800)
Neutrophils Relative %: 49 %
Platelets: 279 10*3/uL (ref 140–400)
RBC: 4.24 10*6/uL (ref 3.80–5.10)
RDW: 13.5 % (ref 11.0–15.0)
Total Lymphocyte: 38.3 %
WBC: 5.8 10*3/uL (ref 3.8–10.8)

## 2019-05-02 LAB — COMPLETE METABOLIC PANEL WITH GFR
AG Ratio: 1.8 (calc) (ref 1.0–2.5)
ALT: 11 U/L (ref 6–29)
AST: 17 U/L (ref 10–35)
Albumin: 4.6 g/dL (ref 3.6–5.1)
Alkaline phosphatase (APISO): 58 U/L (ref 37–153)
BUN: 15 mg/dL (ref 7–25)
CO2: 29 mmol/L (ref 20–32)
Calcium: 9.5 mg/dL (ref 8.6–10.4)
Chloride: 102 mmol/L (ref 98–110)
Creat: 0.76 mg/dL (ref 0.50–0.99)
GFR, Est African American: 96 mL/min/{1.73_m2} (ref >=60)
GFR, Est Non African American: 83 mL/min/{1.73_m2} (ref >=60)
Globulin: 2.6 g/dL (calc) (ref 1.9–3.7)
Glucose, Bld: 96 mg/dL (ref 65–99)
Potassium: 4.2 mmol/L (ref 3.5–5.3)
Sodium: 137 mmol/L (ref 135–146)
Total Bilirubin: 0.4 mg/dL (ref 0.2–1.2)
Total Protein: 7.2 g/dL (ref 6.1–8.1)

## 2019-05-02 LAB — VITAMIN D 25 HYDROXY (VIT D DEFICIENCY, FRACTURES): Vit D, 25-Hydroxy: 34 ng/mL (ref 30–100)

## 2019-05-02 NOTE — Telephone Encounter (Signed)
Attempted to contact patient and left message for patient to call the office. Orders placed for Reclast infusion.

## 2019-05-06 NOTE — Telephone Encounter (Signed)
Patient advised Reclast infusion orders have been placed. Patient provided number for infusion center and she will call to schedule an appointment.

## 2019-05-16 ENCOUNTER — Telehealth: Payer: Self-pay | Admitting: Rheumatology

## 2019-05-16 NOTE — Telephone Encounter (Signed)
Returned patient call.  She states she called [facility] to schedule infusion and was told she was unable to be scheduled until labs were faxed.  Apologized to patient about inconvenience but that there are unclear directions about the process for Reclast infusions.   Orders are in place in [place] completed on 05/01/19 were normal and visible in Epic.  Looking in Epic she was already scheduled for an appointment next week. She was not aware she was scheduled.  Offered to call and discuss what was needed with staff.    Patient is nervous as this is her first infusion and requested to receive her injection at [facility].  Gave patient phone number to medical day at [facility] to schedule infusion [phone]).  Instructed patient to call if she had any issues scheduling.  All questions encouraged and answered.  Instructed patient to call with any further questions or concerns.  Mariella Saa, PharmD, [place], [place] Clinical Specialty Pharmacist [phone]  05/16/2019 2:31 PM

## 2019-05-16 NOTE — Telephone Encounter (Signed)
Patient called stating she called to schedule her Reclast infusion and was told that even though they are in epic they require a faxed copy of labs and orders.  Patient is requesting a return call.

## 2019-05-20 ENCOUNTER — Encounter: Payer: Self-pay | Admitting: Gastroenterology

## 2019-05-21 ENCOUNTER — Ambulatory Visit (HOSPITAL_COMMUNITY)
Admission: RE | Admit: 2019-05-21 | Discharge: 2019-05-21 | Disposition: A | Discharge status: Home / Self Care | Payer: BC Managed Care – PPO | Source: Ambulatory Visit | Attending: Rheumatology | Admitting: Rheumatology

## 2019-05-21 ENCOUNTER — Other Ambulatory Visit: Payer: Self-pay

## 2019-05-21 DIAGNOSIS — M810 Age-related osteoporosis without current pathological fracture: Secondary | ICD-10-CM | POA: Diagnosis present

## 2019-05-21 MED ORDER — DIPHENHYDRAMINE HCL 25 MG PO CAPS
ORAL_CAPSULE | ORAL | Status: AC
  Administered 2019-05-21: 25 mg via ORAL
  Filled 2019-05-21: qty 1

## 2019-05-21 MED ORDER — ACETAMINOPHEN 325 MG PO TABS
650.0000 mg | ORAL_TABLET | ORAL | Status: AC
  Administered 2019-05-21: 11:00:00 650 mg via ORAL

## 2019-05-21 MED ORDER — ZOLEDRONIC ACID 5 MG/100ML IV SOLN
5.0000 mg | Freq: Once | INTRAVENOUS | Status: AC
  Administered 2019-05-21: 5 mg via INTRAVENOUS

## 2019-05-21 MED ORDER — ACETAMINOPHEN 325 MG PO TABS
ORAL_TABLET | ORAL | Status: AC
  Administered 2019-05-21: 650 mg via ORAL
  Filled 2019-05-21: qty 2

## 2019-05-21 MED ORDER — ZOLEDRONIC ACID 5 MG/100ML IV SOLN
INTRAVENOUS | Status: AC
  Administered 2019-05-21: 5 mg via INTRAVENOUS
  Filled 2019-05-21: qty 100

## 2019-05-21 MED ORDER — DIPHENHYDRAMINE HCL 25 MG PO CAPS
25.0000 mg | ORAL_CAPSULE | ORAL | Status: AC
  Administered 2019-05-21: 11:00:00 25 mg via ORAL

## 2019-05-23 ENCOUNTER — Encounter (HOSPITAL_COMMUNITY): Payer: BC Managed Care – PPO

## 2020-02-12 NOTE — Progress Notes (Signed)
Office Visit Note  Patient: Brianna Gardner             Date of Birth: April 14, 1956           MRN: [id number]             PCP: Hoyt Koch, MD Referring: Hoyt Koch, * Visit Date: 02/13/2020 Occupation: @GUAROCC @  Subjective:  Other (right hand index finger pain/swelling )   History of Present Illness: Donnika Padmanabhan is a 64 y.o. female with history of osteoarthritis and osteoporosis.  She states she was doing gardening recently and pulled on something after that she started having swelling in her right index finger.  She states the swelling has gone down but has not completely resolved.  She has not taken any medications except for Tylenol.  She has been taking calcium and vitamin D.  She also had Reclast infusion last June which she tolerated well.  Activities of Daily Living:  Patient reports morning stiffness for 5-10 minutes.   Patient Denies nocturnal pain.  Difficulty dressing/grooming: Denies Difficulty climbing stairs: Denies Difficulty getting out of chair: Denies Difficulty using hands for taps, buttons, cutlery, and/or writing: Denies  Review of Systems  Constitutional: Negative for fatigue, night sweats, weight gain and weight loss.  HENT: Negative for mouth sores, trouble swallowing, trouble swallowing, mouth dryness and nose dryness.   Eyes: Negative for pain, redness, itching, visual disturbance and dryness.  Respiratory: Negative for cough, shortness of breath and difficulty breathing.   Cardiovascular: Negative for chest pain, palpitations, hypertension, irregular heartbeat and swelling in legs/feet.  Gastrointestinal: Negative for blood in stool, constipation and diarrhea.  Endocrine: Negative for increased urination.  Genitourinary: Negative for difficulty urinating and vaginal dryness.  Musculoskeletal: Positive for arthralgias, joint pain, joint swelling and morning stiffness. Negative for myalgias, muscle weakness, muscle tenderness  and myalgias.  Skin: Negative for color change, rash, hair loss, redness, skin tightness, ulcers and sensitivity to sunlight.  Allergic/Immunologic: Negative for susceptible to infections.  Neurological: Negative for dizziness, numbness, headaches, memory loss, night sweats and weakness.  Hematological: Negative for bruising/bleeding tendency and swollen glands.  Psychiatric/Behavioral: Negative for depressed mood, confusion and sleep disturbance. The patient is not nervous/anxious.     PMFS History:  Patient Active Problem List   Diagnosis Date Noted  . Age-related osteoporosis without current pathological fracture 02/04/2017  . Primary osteoarthritis of both feet 01/24/2017  . Primary osteoarthritis of both knees 01/24/2017  . ANA positive 08/04/2016  . Routine general medical examination at a health care facility 12/18/2015  . Primary osteoarthritis of both hands   . Allergic rhinitis, cause unspecified     Past Medical History:  Diagnosis Date  . Allergic rhinitis, cause unspecified   . Osteoarthritis    hands, great toes B - follows with rheum  . Osteopenia 07/2009   DEXA 08/06/09: -1.9 fem    Family History  Problem Relation Age of Onset  . Arthritis Father   . Stroke Father 46  . Arthritis Mother   . Dementia Mother 39       mild-mod, in ALF, ambulatory  . Macular degeneration Mother 22       "dry"  . Stroke Paternal Grandmother   . Stroke Maternal Grandmother   . Arthritis Sister   . Arthritis Brother   . Arthritis Sister    Past Surgical History:  Procedure Laterality Date  . 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 Influenza Split 07/09/2013  . Influenza,inj,Quad PF,6+ Mos 07/12/2017  . Influenza-Unspecified 08/10/2015, 08/10/2018  . Pneumococcal Polysaccharide-23  10/10/2009  . Tdap 01/16/2012  . Zoster 12/18/2015  . Zoster Recombinat (Shingrix) 08/04/2017, 10/20/2017     Objective: Vital Signs: BP 101/65 (BP Location: Left Arm, Patient Position: Sitting, Cuff Size: Normal)   Pulse 62   Resp 14   Ht 5' 5.5" (1.664 m)   Wt 136 lb 6.4 oz (61.9 kg)   BMI 22.35 kg/m    Physical Exam Vitals and nursing note reviewed.  Constitutional:      Appearance: She is well-developed.  HENT:     Head: Normocephalic and atraumatic.  Eyes:     Conjunctiva/sclera: Conjunctivae normal.  Cardiovascular:     Rate and Rhythm: Normal rate and regular rhythm.     Heart sounds: Normal heart sounds.  Pulmonary:     Effort: Pulmonary effort is normal.     Breath sounds: Normal breath sounds.  Abdominal:     General: Bowel sounds are normal.     Palpations: Abdomen is soft.  Musculoskeletal:     Cervical back: Normal range of motion.  Lymphadenopathy:     Cervical: No cervical adenopathy.  Skin:    General: Skin is warm and dry.     Capillary Refill: Capillary refill takes less than 2 seconds.  Neurological:     Mental Status: She is alert and oriented to person, place, and time.  Psychiatric:        Behavior: Behavior normal.      Musculoskeletal Exam: C-spine thoracic and lumbar spine were in good range of motion.  Shoulder joints, elbow joints, wrist joints with good range of motion.  She has bilateral CMC PIP and DIP thickening with swelling and effusion in her right second PIP.  A mucinous cyst was also present over the second PIP.  She has subluxation of some of her DIP joints.  Hip joints, knee joints, ankles with good range of motion.  She has no tenderness across MTPs or PIPs.  CDAI Exam: CDAI Score: -- Patient Global: --; Provider Global: -- Swollen: --; Tender: -- Joint Exam 02/13/2020   No joint exam has been documented for this visit   There is currently no information documented on the homunculus. Go to the Rheumatology activity and  complete the homunculus joint exam.  Investigation: No additional findings.  Imaging: No results found.  Recent Labs: Lab Results  Component Value Date   WBC 5.8 05/01/2019   HGB 13.0 05/01/2019   PLT 279 05/01/2019   NA 137 05/01/2019   K 4.2 05/01/2019   CL 102 05/01/2019   CO2 29 05/01/2019   GLUCOSE 96 05/01/2019   BUN 15 05/01/2019   CREATININE 0.76 05/01/2019   BILITOT 0.4 05/01/2019   ALKPHOS 60 07/12/2017   AST 17 05/01/2019   ALT 11 05/01/2019   PROT 7.2 05/01/2019   ALBUMIN 4.5 07/12/2017   CALCIUM 9.5 05/01/2019   GFRAA 96 05/01/2019    Speciality Comments: No specialty comments available.  Procedures:  No procedures performed Allergies: Demerol, Meperidine, and Penicillins   Assessment / Plan:     Visit Diagnoses: Age-related osteoporosis without current pathological fracture - DEXA 01/11/2017 BMD 0.482 T score -3.3 left femoral neck. DEXA on 03/20/19 BMD 0.494 T-score -3.2 Left femoral neck. IV Reclast 05/21/2019  -  she will get next IV Reclast in August.  She will need repeat DEXA in June 2022.  Plan: VITAMIN D 25 Hydroxy (Vit-D Deficiency, Fractures) prior to her next IV Reclast.  Medication monitoring encounter - Plan: CBC with Differential/Platelet, COMPLETE METABOLIC PANEL WITH GFR will obtain prior to Reclast infusion.  Primary osteoarthritis of both hands-she has severe osteoarthritis in her hands.  Joint protection was discussed.  She also has swelling of her right second PIP joint due to recent injury after pulling weeds.  The swelling has been decreasing although she still has a small effusion in the joint and mucinous cyst present.  I offered cortisone injection but she would like to hold off.  She will try some oral NSAIDs over-the-counter to see if the swelling will go down.  I have also advised her to buddy tape her finger to rest.  If her symptoms persist she can come in for cortisone injection.  Primary osteoarthritis of both knees-she is  currently not having much discomfort.  Primary osteoarthritis of both feet-doing well.  ANA positive - 1:160, ENA -, C3 and C4 normal.  She has no clinical features of autoimmune disease.  Enostosis of femur  Orders: Orders Placed This Encounter  Procedures  . CBC with Differential/Platelet  . COMPLETE METABOLIC PANEL WITH GFR  . VITAMIN D 25 Hydroxy (Vit-D Deficiency, Fractures)   No orders of the defined types were placed in this encounter.     Follow-Up Instructions: Return in about 6 months (around 08/15/2020) for Osteoporosis, Osteoarthritis.   Bo Merino, MD  Note - This record has been created using Editor, commissioning.  Chart creation errors have been sought, but may not always  have been located. Such creation errors do not reflect on  the standard of medical care.

## 2020-02-13 ENCOUNTER — Other Ambulatory Visit: Payer: Self-pay

## 2020-02-13 ENCOUNTER — Encounter: Payer: Self-pay | Admitting: Rheumatology

## 2020-02-13 ENCOUNTER — Ambulatory Visit: Payer: BC Managed Care – PPO | Admitting: Rheumatology

## 2020-02-13 ENCOUNTER — Telehealth: Payer: Self-pay

## 2020-02-13 VITALS — BP 101/65 | HR 62 | Resp 14 | Ht 65.5 in | Wt 136.4 lb

## 2020-02-13 DIAGNOSIS — M170 Bilateral primary osteoarthritis of knee: Secondary | ICD-10-CM

## 2020-02-13 DIAGNOSIS — M810 Age-related osteoporosis without current pathological fracture: Secondary | ICD-10-CM | POA: Diagnosis not present

## 2020-02-13 DIAGNOSIS — M898X5 Other specified disorders of bone, thigh: Secondary | ICD-10-CM

## 2020-02-13 DIAGNOSIS — R768 Other specified abnormal immunological findings in serum: Secondary | ICD-10-CM

## 2020-02-13 DIAGNOSIS — M19072 Primary osteoarthritis, left ankle and foot: Secondary | ICD-10-CM

## 2020-02-13 DIAGNOSIS — Z5181 Encounter for therapeutic drug level monitoring: Secondary | ICD-10-CM | POA: Diagnosis not present

## 2020-02-13 DIAGNOSIS — M19071 Primary osteoarthritis, right ankle and foot: Secondary | ICD-10-CM

## 2020-02-13 DIAGNOSIS — M19041 Primary osteoarthritis, right hand: Secondary | ICD-10-CM

## 2020-02-13 DIAGNOSIS — M19042 Primary osteoarthritis, left hand: Secondary | ICD-10-CM

## 2020-02-13 NOTE — Telephone Encounter (Signed)
IV Reclast due in August 2021, per Dr. Estanislado Pandy. Dr. Estanislado Pandy advised patient to come for labs prior to infusion.   Thanks!

## 2020-02-13 NOTE — Telephone Encounter (Signed)
Findings of benefits investigation for Reclast :  Brianna Gardner  Phone:  [phone]  Plan is ACTIVE as of 10/11/19  Per Insurance, Reclast- [phone]- does NOT require precertification. Patient's plan has a $3,300 max out of pocket, in which zero has been met.- Part B drugs Do Not apply to [facility] and are covered at 100%.  For drug, patient's portion would be zero. Patient could have some portion for administration fees, but it would depend on billing.  2:16 PM Beatriz Chancellor, CPhT

## 2020-02-14 NOTE — Telephone Encounter (Signed)
Thank you.  Will follow up with patient closer to infusion date about labs, placing orders, and scheduling infusion.   Mariella Saa, PharmD, [place], [place] Clinical Specialty Pharmacist [phone]  02/14/2020 9:57 AM

## 2020-05-19 ENCOUNTER — Telehealth: Payer: Self-pay | Admitting: Pharmacist

## 2020-05-19 DIAGNOSIS — Z5181 Encounter for therapeutic drug level monitoring: Secondary | ICD-10-CM

## 2020-05-19 NOTE — Telephone Encounter (Signed)
LVM reminding patient she is due for Reclast infusion and will need updated lab work prior to placing orders and scheduling infusion.   CBC/CMP/vitamin D future orders placed.   Mariella Saa, PharmD, [place], CPP Clinical Specialty Pharmacist (Rheumatology and Pulmonology)  05/19/2020 11:03 AM

## 2020-05-27 ENCOUNTER — Other Ambulatory Visit: Payer: Self-pay | Admitting: *Deleted

## 2020-05-27 DIAGNOSIS — Z5181 Encounter for therapeutic drug level monitoring: Secondary | ICD-10-CM

## 2020-05-28 ENCOUNTER — Other Ambulatory Visit: Payer: Self-pay | Admitting: Pharmacist

## 2020-05-28 DIAGNOSIS — M810 Age-related osteoporosis without current pathological fracture: Secondary | ICD-10-CM

## 2020-05-28 LAB — COMPLETE METABOLIC PANEL WITH GFR
AG Ratio: 1.5 (calc) (ref 1.0–2.5)
ALT: 15 U/L (ref 6–29)
AST: 20 U/L (ref 10–35)
Albumin: 4.4 g/dL (ref 3.6–5.1)
Alkaline phosphatase (APISO): 69 U/L (ref 37–153)
BUN: 17 mg/dL (ref 7–25)
CO2: 27 mmol/L (ref 20–32)
Calcium: 9.2 mg/dL (ref 8.6–10.4)
Chloride: 100 mmol/L (ref 98–110)
Creat: 0.67 mg/dL (ref 0.50–0.99)
GFR, Est African American: 107 mL/min/{1.73_m2} (ref >=60)
GFR, Est Non African American: 92 mL/min/{1.73_m2} (ref >=60)
Globulin: 2.9 g/dL (calc) (ref 1.9–3.7)
Glucose, Bld: 138 mg/dL — ABNORMAL HIGH (ref 65–99)
Potassium: 3.8 mmol/L (ref 3.5–5.3)
Sodium: 135 mmol/L (ref 135–146)
Total Bilirubin: 0.5 mg/dL (ref 0.2–1.2)
Total Protein: 7.3 g/dL (ref 6.1–8.1)

## 2020-05-28 LAB — CBC WITH DIFFERENTIAL/PLATELET
Absolute Monocytes: 342 cells/uL (ref 200–950)
Basophils Absolute: 50 cells/uL (ref 0–200)
Basophils Relative: 0.9 %
Eosinophils Absolute: 179 cells/uL (ref 15–500)
Eosinophils Relative: 3.2 %
HCT: 37.9 % (ref 35.0–45.0)
Hemoglobin: 12.6 g/dL (ref 11.7–15.5)
Lymphs Abs: 2167 cells/uL (ref 850–3900)
MCH: 30 pg (ref 27.0–33.0)
MCHC: 33.2 g/dL (ref 32.0–36.0)
MCV: 90.2 fL (ref 80.0–100.0)
MPV: 10 fL (ref 7.5–12.5)
Monocytes Relative: 6.1 %
Neutro Abs: 2862 cells/uL (ref 1500–7800)
Neutrophils Relative %: 51.1 %
Platelets: 261 10*3/uL (ref 140–400)
RBC: 4.2 10*6/uL (ref 3.80–5.10)
RDW: 12.7 % (ref 11.0–15.0)
Total Lymphocyte: 38.7 %
WBC: 5.6 10*3/uL (ref 3.8–10.8)

## 2020-05-28 LAB — VITAMIN D 25 HYDROXY (VIT D DEFICIENCY, FRACTURES): Vit D, 25-Hydroxy: 30 ng/mL (ref 30–100)

## 2020-05-28 NOTE — Progress Notes (Signed)
CBC/CMP/Vitamin D within normal limits.  She is due for yearly Reclast infusion.  Orders placed and patient may call [facility] to schedule appointment.

## 2020-05-28 NOTE — Telephone Encounter (Signed)
Patient had labs drawn on 8/18/21and within normal limits.  Reclast orders placed.  Nothing further needed.   Mariella Saa, PharmD, [place], CPP Clinical Specialty Pharmacist (Rheumatology and Pulmonology)  05/28/2020 8:38 AM

## 2020-05-28 NOTE — Progress Notes (Signed)
Patient is due for yearly Reclast infusion.  CBC/CMP/vitamin D within normal limits on 05/27/20.  Orders for Reclast placed.   Mariella Saa, PharmD, [place], CPP Clinical Specialty Pharmacist (Rheumatology and Pulmonology)  05/28/2020 8:20 AM

## 2020-06-10 ENCOUNTER — Other Ambulatory Visit: Payer: Self-pay

## 2020-06-10 ENCOUNTER — Ambulatory Visit (HOSPITAL_COMMUNITY)
Admission: RE | Admit: 2020-06-10 | Discharge: 2020-06-10 | Disposition: A | Discharge status: Home / Self Care | Payer: Medicare PPO | Source: Ambulatory Visit | Attending: Rheumatology | Admitting: Rheumatology

## 2020-06-10 DIAGNOSIS — M810 Age-related osteoporosis without current pathological fracture: Secondary | ICD-10-CM | POA: Insufficient documentation

## 2020-06-10 MED ORDER — ZOLEDRONIC ACID 5 MG/100ML IV SOLN
5.0000 mg | Freq: Once | INTRAVENOUS | Status: AC
  Administered 2020-06-10: 5 mg via INTRAVENOUS

## 2020-06-10 MED ORDER — ZOLEDRONIC ACID 5 MG/100ML IV SOLN
INTRAVENOUS | Status: AC
  Filled 2020-06-10: qty 100

## 2020-06-10 MED ORDER — ACETAMINOPHEN 325 MG PO TABS
650.0000 mg | ORAL_TABLET | Freq: Once | ORAL | Status: AC
  Administered 2020-06-10: 650 mg via ORAL

## 2020-06-10 MED ORDER — ACETAMINOPHEN 325 MG PO TABS
ORAL_TABLET | ORAL | Status: AC
  Filled 2020-06-10: qty 2

## 2020-06-10 MED ORDER — SODIUM CHLORIDE 0.9 % IV SOLN: INTRAVENOUS | Status: DC

## 2020-06-10 MED ORDER — DIPHENHYDRAMINE HCL 25 MG PO CAPS
ORAL_CAPSULE | ORAL | Status: AC
  Filled 2020-06-10: qty 1

## 2020-06-10 MED ORDER — DIPHENHYDRAMINE HCL 25 MG PO CAPS
25.0000 mg | ORAL_CAPSULE | Freq: Once | ORAL | Status: AC
  Administered 2020-06-10: 25 mg via ORAL

## 2020-06-22 ENCOUNTER — Encounter: Payer: Self-pay | Admitting: Gastroenterology

## 2020-08-07 ENCOUNTER — Other Ambulatory Visit: Payer: Self-pay

## 2020-08-07 ENCOUNTER — Ambulatory Visit (AMBULATORY_SURGERY_CENTER): Payer: Self-pay | Admitting: *Deleted

## 2020-08-07 VITALS — Ht 65.5 in | Wt 132.0 lb

## 2020-08-07 DIAGNOSIS — Z1211 Encounter for screening for malignant neoplasm of colon: Secondary | ICD-10-CM

## 2020-08-07 MED ORDER — SUPREP BOWEL PREP KIT 17.5-3.13-1.6 GM/177ML PO SOLN: 1.0000 | Freq: Once | ORAL | 0 refills | Status: AC

## 2020-08-07 NOTE — Progress Notes (Signed)

## 2020-08-11 ENCOUNTER — Encounter: Payer: Self-pay | Admitting: Gastroenterology

## 2020-08-21 ENCOUNTER — Encounter: Payer: Self-pay | Admitting: Gastroenterology

## 2020-08-21 ENCOUNTER — Ambulatory Visit (AMBULATORY_SURGERY_CENTER): Payer: Medicare PPO | Admitting: Gastroenterology

## 2020-08-21 ENCOUNTER — Other Ambulatory Visit: Payer: Self-pay

## 2020-08-21 VITALS — BP 105/75 | HR 52 | Temp 97.5°F | Resp 17 | Ht 65.0 in | Wt 132.0 lb

## 2020-08-21 DIAGNOSIS — Z1211 Encounter for screening for malignant neoplasm of colon: Secondary | ICD-10-CM

## 2020-08-21 DIAGNOSIS — D124 Benign neoplasm of descending colon: Secondary | ICD-10-CM

## 2020-08-21 DIAGNOSIS — D123 Benign neoplasm of transverse colon: Secondary | ICD-10-CM | POA: Diagnosis not present

## 2020-08-21 MED ORDER — SODIUM CHLORIDE 0.9 % IV SOLN: 500.0000 mL | INTRAVENOUS | Status: DC

## 2020-08-21 NOTE — Progress Notes (Signed)
V/s Cw  I have reviewed the patient's medical history in detail and updated the computerized patient record.

## 2020-08-21 NOTE — Progress Notes (Signed)
Called to room to assist during endoscopic procedure.  Patient ID and intended procedure confirmed with present staff. Received instructions for my participation in the procedure from the performing physician.  

## 2020-08-21 NOTE — Op Note (Signed)
Brianna Gardner Patient Name: Brianna Gardner Procedure Date: 08/21/2020 8:24 AM MRN: [id number] Endoscopist: Ladene Artist , MD Age: 64 Referring MD:  Date of Birth: 1955-10-11 Gender: Female Account #: [id number] Procedure:                Colonoscopy Indications:              Screening for colorectal malignant neoplasm Medicines:                Monitored Anesthesia Care Procedure:                Pre-Anesthesia Assessment:                           - Prior to the procedure, a History and Physical                            was performed, and patient medications and                            allergies were reviewed. The patient's tolerance of                            previous anesthesia was also reviewed. The risks                            and benefits of the procedure and the sedation                            options and risks were discussed with the patient.                            All questions were answered, and informed consent                            was obtained. Prior Anticoagulants: The patient has                            taken no previous anticoagulant or antiplatelet                            agents. ASA Grade Assessment: II - A patient with                            mild systemic disease. After reviewing the risks                            and benefits, the patient was deemed in                            satisfactory condition to undergo the procedure.                           After obtaining informed consent, the colonoscope  was passed under direct vision. Throughout the                            procedure, the patient's blood pressure, pulse, and                            oxygen saturations were monitored continuously. The                            Colonoscope was introduced through the anus and                            advanced to the the cecum, identified by                            appendiceal orifice  and ileocecal valve. The                            ileocecal valve, appendiceal orifice, and rectum                            were photographed. The quality of the bowel                            preparation was excellent. The colonoscopy was                            performed without difficulty. The patient tolerated                            the procedure well. Scope In: 8:32:21 AM Scope Out: 8:52:30 AM Scope Withdrawal Time: 0 hours 15 minutes 10 seconds  Total Procedure Duration: 0 hours 20 minutes 9 seconds  Findings:                 The perianal and digital rectal examinations were                            normal.                           Two sessile polyps were found in the descending                            colon and transverse colon. The polyps were 6 mm in                            size. These polyps were removed with a cold snare.                            Resection and retrieval were complete.                           The exam was otherwise without abnormality on  direct and retroflexion views. Complications:            No immediate complications. Estimated blood loss:                            None. Estimated Blood Loss:     Estimated blood loss: none. Impression:               - Two 6 mm polyps in the descending colon and in                            the transverse colon, removed with a cold snare.                            Resected and retrieved.                           - The examination was otherwise normal on direct                            and retroflexion views. Recommendation:           - Repeat colonoscopy after studies are complete for                            surveillance based on pathology results.                           - Patient has a contact number available for                            emergencies. The signs and symptoms of potential                            delayed complications were discussed with the                             patient. Return to normal activities tomorrow.                            Written discharge instructions were provided to the                            patient.                           - Resume previous diet.                           - Continue present medications.                           - Await pathology results. Ladene Artist, MD 08/21/2020 8:55:03 AM This report has been signed electronically.

## 2020-08-21 NOTE — Patient Instructions (Signed)
Handout given for polyps.  Await pathology results.  YOU HAD AN ENDOSCOPIC PROCEDURE TODAY AT THE [facility]:   Refer to the procedure report that was given to you for any specific questions about what was found during the examination.  If the procedure report does not answer your questions, please call your gastroenterologist to clarify.  If you requested that your care partner not be given the details of your procedure findings, then the procedure report has been included in a sealed envelope for you to review at your convenience later.  YOU SHOULD EXPECT: Some feelings of bloating in the abdomen. Passage of more gas than usual.  Walking can help get rid of the air that was put into your GI tract during the procedure and reduce the bloating. If you had a lower endoscopy (such as a colonoscopy or flexible sigmoidoscopy) you may notice spotting of blood in your stool or on the toilet paper. If you underwent a bowel prep for your procedure, you may not have a normal bowel movement for a few days.  Please Note:  You might notice some irritation and congestion in your nose or some drainage.  This is from the oxygen used during your procedure.  There is no need for concern and it should clear up in a day or so.  SYMPTOMS TO REPORT IMMEDIATELY:   Following lower endoscopy (colonoscopy or flexible sigmoidoscopy):  Excessive amounts of blood in the stool  Significant tenderness or worsening of abdominal pains  Swelling of the abdomen that is new, acute  Fever of 100F or higher  For urgent or emergent issues, a gastroenterologist can be reached at any hour by calling [phone]. Do not use MyChart messaging for urgent concerns.    DIET:  We do recommend a small meal at first, but then you may proceed to your regular diet.  Drink plenty of fluids but you should avoid alcoholic beverages for 24 hours.  ACTIVITY:  You should plan to take it easy for the rest of today and you should  NOT DRIVE or use heavy machinery until tomorrow (because of the sedation medicines used during the test).    FOLLOW UP: Our staff will call the number listed on your records 48-72 hours following your procedure to check on you and address any questions or concerns that you may have regarding the information given to you following your procedure. If we do not reach you, we will leave a message.  We will attempt to reach you two times.  During this call, we will ask if you have developed any symptoms of COVID 19. If you develop any symptoms (ie: fever, flu-like symptoms, shortness of breath, cough etc.) before then, please call [phone].  If you test positive for Covid 19 in the 2 weeks post procedure, please call and report this information to us.    If any biopsies were taken you will be contacted by phone or by letter within the next 1-3 weeks.  Please call us at [phone] if you have not heard about the biopsies in 3 weeks.    SIGNATURES/CONFIDENTIALITY: You and/or your care partner have signed paperwork which will be entered into your electronic medical record.  These signatures attest to the fact that that the information above on your After Visit Summary has been reviewed and is understood.  Full responsibility of the confidentiality of this discharge information lies with you and/or your care-partner. 

## 2020-08-21 NOTE — Progress Notes (Signed)
PT taken to PACU. Monitors in place. VSS. Report given to RN. 

## 2020-08-25 ENCOUNTER — Telehealth: Payer: Self-pay

## 2020-08-25 ENCOUNTER — Telehealth: Payer: Self-pay | Admitting: *Deleted

## 2020-08-25 NOTE — Telephone Encounter (Signed)
First follow up call attempt.  LVM. 

## 2020-08-25 NOTE — Telephone Encounter (Signed)
No answer, left message to call if having any issues or concerns, B.Lenwood Balsam RN 

## 2020-09-01 ENCOUNTER — Encounter: Payer: Self-pay | Admitting: Gastroenterology

## 2020-09-18 ENCOUNTER — Other Ambulatory Visit: Payer: Self-pay

## 2020-09-18 ENCOUNTER — Ambulatory Visit (INDEPENDENT_AMBULATORY_CARE_PROVIDER_SITE_OTHER): Payer: Medicare PPO | Admitting: Internal Medicine

## 2020-09-18 ENCOUNTER — Encounter: Payer: Self-pay | Admitting: Internal Medicine

## 2020-09-18 VITALS — BP 120/76 | HR 56 | Temp 98.2°F | Ht 65.0 in | Wt 138.0 lb

## 2020-09-18 DIAGNOSIS — Z136 Encounter for screening for cardiovascular disorders: Secondary | ICD-10-CM

## 2020-09-18 DIAGNOSIS — Z23 Encounter for immunization: Secondary | ICD-10-CM

## 2020-09-18 DIAGNOSIS — Z0000 Encounter for general adult medical examination without abnormal findings: Secondary | ICD-10-CM | POA: Diagnosis not present

## 2020-09-18 DIAGNOSIS — M810 Age-related osteoporosis without current pathological fracture: Secondary | ICD-10-CM | POA: Diagnosis not present

## 2020-09-18 LAB — COMPREHENSIVE METABOLIC PANEL
ALT: 14 U/L (ref 0–35)
AST: 24 U/L (ref 0–37)
Albumin: 4.5 g/dL (ref 3.5–5.2)
Alkaline Phosphatase: 53 U/L (ref 39–117)
BUN: 15 mg/dL (ref 6–23)
CO2: 28 mEq/L (ref 19–32)
Calcium: 9.2 mg/dL (ref 8.4–10.5)
Chloride: 100 mEq/L (ref 96–112)
Creatinine, Ser: 0.86 mg/dL (ref 0.40–1.20)
GFR: 70.66 mL/min (ref >60.00)
Glucose, Bld: 88 mg/dL (ref 70–99)
Potassium: 4.1 mEq/L (ref 3.5–5.1)
Sodium: 136 mEq/L (ref 135–145)
Total Bilirubin: 0.8 mg/dL (ref 0.2–1.2)
Total Protein: 7.8 g/dL (ref 6.0–8.3)

## 2020-09-18 LAB — CBC
HCT: 37.9 % (ref 36.0–46.0)
Hemoglobin: 13.1 g/dL (ref 12.0–15.0)
MCHC: 34.4 g/dL (ref 30.0–36.0)
MCV: 88.6 fl (ref 78.0–100.0)
Platelets: 270 10*3/uL (ref 150.0–400.0)
RBC: 4.28 Mil/uL (ref 3.87–5.11)
RDW: 13.1 % (ref 11.5–15.5)
WBC: 4.5 10*3/uL (ref 4.0–10.5)

## 2020-09-18 LAB — LIPID PANEL
Cholesterol: 269 mg/dL — ABNORMAL HIGH (ref 0–200)
HDL: 80.4 mg/dL (ref >39.00)
LDL Cholesterol: 177 mg/dL — ABNORMAL HIGH (ref 0–99)
NonHDL: 188.46
Total CHOL/HDL Ratio: 3
Triglycerides: 56 mg/dL (ref 0.0–149.0)
VLDL: 11.2 mg/dL (ref 0.0–40.0)

## 2020-09-18 NOTE — Assessment & Plan Note (Signed)
Flu shot up to date. Covid-19 up to date including booster will send picture of card. Pneumonia given 13 today. Shingrix complete. Tetanus due 2023. Colonoscopy due 2031. Mammogram due 2022, pap smear due 2023 and dexa due June 2022. Counseled about sun safety and mole surveillance. Counseled about the dangers of distracted driving. Given 10 year screening recommendations.

## 2020-09-18 NOTE — Patient Instructions (Signed)
Health Maintenance, Female Adopting a healthy lifestyle and getting preventive care are important in promoting health and wellness. Ask your health care provider about:  The right schedule for you to have regular tests and exams.  Things you can do on your own to prevent diseases and keep yourself healthy. What should I know about diet, weight, and exercise? Eat a healthy diet   Eat a diet that includes plenty of vegetables, fruits, low-fat dairy products, and lean protein.  Do not eat a lot of foods that are high in solid fats, added sugars, or sodium. Maintain a healthy weight Body mass index (BMI) is used to identify weight problems. It estimates body fat based on height and weight. Your health care provider can help determine your BMI and help you achieve or maintain a healthy weight. Get regular exercise Get regular exercise. This is one of the most important things you can do for your health. Most adults should:  Exercise for at least 150 minutes each week. The exercise should increase your heart rate and make you sweat (moderate-intensity exercise).  Do strengthening exercises at least twice a week. This is in addition to the moderate-intensity exercise.  Spend less time sitting. Even light physical activity can be beneficial. Watch cholesterol and blood lipids Have your blood tested for lipids and cholesterol at 65 years of age, then have this test every 5 years. Have your cholesterol levels checked more often if:  Your lipid or cholesterol levels are high.  You are older than 65 years of age.  You are at high risk for heart disease. What should I know about cancer screening? Depending on your health history and family history, you may need to have cancer screening at various ages. This may include screening for:  Breast cancer.  Cervical cancer.  Colorectal cancer.  Skin cancer.  Lung cancer. What should I know about heart disease, diabetes, and high blood  pressure? Blood pressure and heart disease  High blood pressure causes heart disease and increases the risk of stroke. This is more likely to develop in people who have high blood pressure readings, are of African descent, or are overweight.  Have your blood pressure checked: ? Every 3-5 years if you are 18-39 years of age. ? Every year if you are 40 years old or older. Diabetes Have regular diabetes screenings. This checks your fasting blood sugar level. Have the screening done:  Once every three years after age 40 if you are at a normal weight and have a low risk for diabetes.  More often and at a younger age if you are overweight or have a high risk for diabetes. What should I know about preventing infection? Hepatitis B If you have a higher risk for hepatitis B, you should be screened for this virus. Talk with your health care provider to find out if you are at risk for hepatitis B infection. Hepatitis C Testing is recommended for:  Everyone born from 1945 through 1965.  Anyone with known risk factors for hepatitis C. Sexually transmitted infections (STIs)  Get screened for STIs, including gonorrhea and chlamydia, if: ? You are sexually active and are younger than 65 years of age. ? You are older than 65 years of age and your health care provider tells you that you are at risk for this type of infection. ? Your sexual activity has changed since you were last screened, and you are at increased risk for chlamydia or gonorrhea. Ask your health care provider if   you are at risk.  Ask your health care provider about whether you are at high risk for HIV. Your health care provider may recommend a prescription medicine to help prevent HIV infection. If you choose to take medicine to prevent HIV, you should first get tested for HIV. You should then be tested every 3 months for as long as you are taking the medicine. Pregnancy  If you are about to stop having your period (premenopausal) and  you may become pregnant, seek counseling before you get pregnant.  Take 400 to 800 micrograms (mcg) of folic acid every day if you become pregnant.  Ask for birth control (contraception) if you want to prevent pregnancy. Osteoporosis and menopause Osteoporosis is a disease in which the bones lose minerals and strength with aging. This can result in bone fractures. If you are 65 years old or older, or if you are at risk for osteoporosis and fractures, ask your health care provider if you should:  Be screened for bone loss.  Take a calcium or vitamin D supplement to lower your risk of fractures.  Be given hormone replacement therapy (HRT) to treat symptoms of menopause. Follow these instructions at home: Lifestyle  Do not use any products that contain nicotine or tobacco, such as cigarettes, e-cigarettes, and chewing tobacco. If you need help quitting, ask your health care provider.  Do not use street drugs.  Do not share needles.  Ask your health care provider for help if you need support or information about quitting drugs. Alcohol use  Do not drink alcohol if: ? Your health care provider tells you not to drink. ? You are pregnant, may be pregnant, or are planning to become pregnant.  If you drink alcohol: ? Limit how much you use to 0-1 drink a day. ? Limit intake if you are breastfeeding.  Be aware of how much alcohol is in your drink. In the [place] one drink equals one 12 oz bottle of beer (355 mL), one 5 oz glass of wine (148 mL), or one 1 oz glass of hard liquor (44 mL). General instructions  Schedule regular health, dental, and eye exams.  Stay current with your vaccines.  Tell your health care provider if: ? You often feel depressed. ? You have ever been abused or do not feel safe at home. Summary  Adopting a healthy lifestyle and getting preventive care are important in promoting health and wellness.  Follow your health care provider's instructions about healthy  diet, exercising, and getting tested or screened for diseases.  Follow your health care provider's instructions on monitoring your cholesterol and blood pressure. This information is not intended to replace advice given to you by your health care provider. Make sure you discuss any questions you have with your health care provider. Document Revised: 09/19/2018 Document Reviewed: 09/19/2018 Elsevier Patient Education  2020 Elsevier Inc.  

## 2020-09-18 NOTE — Assessment & Plan Note (Signed)
Due dexa 2022 June and she is aware. Reclast times 2 since last DEXA. No current fracture.

## 2020-09-18 NOTE — Progress Notes (Signed)
Subjective:   Patient ID: Brianna Gardner, female    DOB: 1956/07/08, 64 y.o.   MRN: [id number]  HPI Here for new to medicare wellness and physical, no new complaints. Please see A/P for status and treatment of chronic medical problems.   Diet: heart healthy Physical activity: active Depression/mood screen: negative Hearing: intact to whispered voice Visual acuity: grossly normal with lens, performs annual eye exam  ADLs: capable Fall risk: none Home safety: good Cognitive evaluation: intact to orientation, naming, recall and repetition EOL planning: adv directives discussed  [place] Visit from 09/18/2020 in [place] at [facility]  PHQ-2 Total Score 0       Hearing Screening   125Hz  250Hz  500Hz  1000Hz  2000Hz  3000Hz  4000Hz  6000Hz  8000Hz   Right ear:           Left ear:             Visual Acuity Screening   Right eye Left eye Both eyes  Without correction: 20/15 20/20   With correction:       I have personally reviewed and have noted 1. The patient's medical and social history - reviewed today no changes 2. Their use of alcohol, tobacco or illicit drugs 3. Their current medications and supplements 4. The patient's functional ability including ADL's, fall risks, home safety risks and hearing or visual impairment. 5. Diet and physical activities 6. Evidence for depression or mood disorders 7. Care team reviewed and updated  Patient Care Team: Hoyt Koch, MD as PCP - General (Internal Medicine) Roque Cash., MD (Obstetrics and Gynecology) Bo Merino, MD (Rheumatology) Ladene Artist, MD (Gastroenterology) Marygrace Drought, MD (Ophthalmology) Yvone Neu, MD (Dermatology) Past Medical History:  Diagnosis Date  . Allergic rhinitis, cause unspecified   . Allergy   . Cataract    forming   . Osteoarthritis    hands, great toes B - follows with rheum  . Osteopenia 07/2009   DEXA 08/06/09: -1.9 fem   Past Surgical  History:  Procedure Laterality Date  . COLONOSCOPY  06/12/2009  . uretheral cyst removed  1981   Family History  Problem Relation Age of Onset  . Arthritis Father   . Stroke Father 50  . Arthritis Mother   . Dementia Mother 7       mild-mod, in ALF, ambulatory  . Macular degeneration Mother 9       "dry"  . Stroke Paternal Grandmother   . Stroke Maternal Grandmother   . Arthritis Sister   . Arthritis Brother   . Arthritis Sister   . Colon cancer Neg Hx   . Colon polyps Neg Hx   . Esophageal cancer Neg Hx   . Rectal cancer Neg Hx   . Stomach cancer Neg Hx     Review of Systems  Constitutional: Negative.   HENT: Negative.   Eyes: Negative.   Respiratory: Negative for cough, chest tightness and shortness of breath.   Cardiovascular: Negative for chest pain, palpitations and leg swelling.  Gastrointestinal: Negative for abdominal distention, abdominal pain, constipation, diarrhea, nausea and vomiting.  Musculoskeletal: Negative.   Skin: Negative.   Neurological: Negative.   Psychiatric/Behavioral: Negative.     Objective:  Physical Exam Constitutional:      Appearance: She is well-developed and well-nourished.  HENT:     Head: Normocephalic and atraumatic.  Eyes:     Extraocular Movements: EOM normal.  Cardiovascular:     Rate and Rhythm: Normal rate and regular rhythm.  Pulmonary:     Effort: Pulmonary effort is normal. No respiratory distress.     Breath sounds: Normal breath sounds. No wheezing or rales.  Abdominal:     General: Bowel sounds are normal. There is no distension.     Palpations: Abdomen is soft.     Tenderness: There is no abdominal tenderness. There is no rebound.  Musculoskeletal:        General: No edema.     Cervical back: Normal range of motion.  Skin:    General: Skin is warm and dry.  Neurological:     Mental Status: She is alert and oriented to person, place, and time.     Coordination: Coordination normal.  Psychiatric:         Mood and Affect: Mood and affect normal.    Vitals:   09/18/20 0812  BP: 120/76  Pulse: (!) 56  Temp: 98.2 F (36.8 C)  TempSrc: Oral  SpO2: 97%  Weight: 138 lb (62.6 kg)  Height: 5\' 5"  (1.651 m)   This visit occurred during the SARS-CoV-2 public health emergency.  Safety protocols were in place, including screening questions prior to the visit, additional usage of staff PPE, and extensive cleaning of exam room while observing appropriate contact time as indicated for disinfecting solutions.   Assessment & Plan:  Pneumonia 13 given at visit

## 2020-09-28 ENCOUNTER — Encounter: Payer: Self-pay | Admitting: Internal Medicine

## 2020-10-09 ENCOUNTER — Encounter: Payer: Self-pay | Admitting: Internal Medicine

## 2021-02-08 DIAGNOSIS — Z1231 Encounter for screening mammogram for malignant neoplasm of breast: Secondary | ICD-10-CM | POA: Diagnosis not present

## 2021-02-23 DIAGNOSIS — Z01419 Encounter for gynecological examination (general) (routine) without abnormal findings: Secondary | ICD-10-CM | POA: Diagnosis not present

## 2021-02-23 DIAGNOSIS — N952 Postmenopausal atrophic vaginitis: Secondary | ICD-10-CM | POA: Diagnosis not present

## 2021-02-23 DIAGNOSIS — N905 Atrophy of vulva: Secondary | ICD-10-CM | POA: Diagnosis not present

## 2021-03-05 ENCOUNTER — Telehealth: Payer: Self-pay | Admitting: Internal Medicine

## 2021-03-05 NOTE — Telephone Encounter (Signed)
See below

## 2021-03-05 NOTE — Telephone Encounter (Signed)
It is recommended for anyone 14 or older. If she has specific questions we can do visit to discuss more.

## 2021-03-05 NOTE — Telephone Encounter (Signed)
Unable to get in contact with the patient. LDVM with Dr. Nathanial Millman recommendations. Office number was provided in case she any additional questions or concerns.

## 2021-03-05 NOTE — Telephone Encounter (Signed)
Patient called and was wanting Dr. Nathanial Millman opinion on getting the 2nd booster shot. She can be reached at  [phone]. Please advise

## 2021-05-14 ENCOUNTER — Telehealth: Payer: Self-pay | Admitting: Pharmacist

## 2021-05-14 DIAGNOSIS — M810 Age-related osteoporosis without current pathological fracture: Secondary | ICD-10-CM

## 2021-05-14 NOTE — Telephone Encounter (Signed)
Attempted to contact the patient and left message for patient to call the office. Order placed for DEXA scan.

## 2021-05-14 NOTE — Telephone Encounter (Signed)
I would recommend repeating DEXA ASAP then we can schedule reclast pending DEXA results and lab results.  She should return for an office visit to discuss updated DEXA results and we can obtain lab work at that time.

## 2021-05-14 NOTE — Telephone Encounter (Signed)
Patient's Reclast history: 05/21/19, 06/10/20.  She is due for third Reclast infusion on or after 06/10/21.  She was last seen for OV in May 2021 with expected follow-up in 6 months.  DEXA 01/11/2017 BMD 0.482 T score -3.3 left femoral neck. DEXA on 03/20/19 BMD 0.494 T-score -3.2 Left femoral neck  She is overdue for repeat DEXA as well (due June 2022).  Knox Saliva, PharmD, MPH, BCPS Clinical Pharmacist (Rheumatology and Pulmonology)

## 2021-05-17 NOTE — Telephone Encounter (Signed)
Patient advised Dr. Estanislado Pandy would recommend repeating DEXA ASAP then we can schedule reclast pending DEXA results and lab results.  Patient advised she should return for an office visit to discuss updated DEXA results and we can obtain lab work at that time. Patient expressed understanding and states she will call and schedule DEXA.

## 2021-05-19 ENCOUNTER — Telehealth: Payer: Self-pay | Admitting: Rheumatology

## 2021-05-19 NOTE — Telephone Encounter (Signed)
Patient calling in reference to order. Per patient, [name] does not have Bone Density order. Please resend to [place]. Patient does not need a call back unless there is a question.

## 2021-05-19 NOTE — Telephone Encounter (Signed)
Order for bone density sent to [facility].

## 2021-06-03 DIAGNOSIS — M8589 Other specified disorders of bone density and structure, multiple sites: Secondary | ICD-10-CM | POA: Diagnosis not present

## 2021-06-08 ENCOUNTER — Telehealth: Payer: Self-pay | Admitting: *Deleted

## 2021-06-08 NOTE — Telephone Encounter (Signed)
Received DEXA results from [facility].  Date of Scan: 06/03/2021  Dx: Osteoporosis   Lowest T-score:-3.0  BMD:0.516  Lowest site measured:Left Femoral Neck  Significant changes in BMD and site measured (5% and above):15 % Left Total Femur  Current Regimen:Reclast Last Infusion: 06/10/2020  Recommendation:Schedule an appointment to discuss results and treatment options.   Reviewed by:Hazel Sams, PA-C

## 2021-06-08 NOTE — Telephone Encounter (Signed)
Attempted to contact the patient and left message for patient to call the office.  

## 2021-06-09 NOTE — Telephone Encounter (Signed)
Spoke with patient and scheduled her for an appointment on 06/23/2021 at 9:20 am.

## 2021-06-09 NOTE — Progress Notes (Signed)
Office Visit Note  Patient: Brianna Gardner             Date of Birth: 1956/09/28           MRN: [id number]             PCP: Hoyt Koch, MD Referring: Hoyt Koch, * Visit Date: 06/23/2021 Occupation: '@GUAROCC'$ @  Subjective:  Discuss DEXA results   History of Present Illness: Brianna Gardner is a 65 y.o. female with history of osteoporosis and osteoarthritis.  Her last IV Reclast infusion was on 06/10/2020.  She has had 2 Reclast infusions total and was previously on Fosamax prior to that.  She  presented today to discuss updated DEXA results from 06/03/21. She denies any falls or fractures since her last office visit.  She takes vitamin D 2000 units daily and consumes calcium through diet.  She remains very active performing 210 minutes of cardio per week, weightlifting 2-3 times per week, and 1 yoga class per week.  She continues to experience some aching and stiffness in both hands but denies any joint swelling. She has seen an endodontist in the past and is considering proceeding with apicoectomy soon.  She plans to call her endodontist today to discuss healing time prior to scheduling the procedure and her next reclast infusion.    Activities of Daily Living:  Patient reports morning stiffness for 5 minutes.   Patient Denies nocturnal pain.  Difficulty dressing/grooming: Denies Difficulty climbing stairs: Denies Difficulty getting out of chair: Denies Difficulty using hands for taps, buttons, cutlery, and/or writing: Denies  Review of Systems  Constitutional:  Negative for fatigue.  HENT:  Negative for mouth sores, mouth dryness and nose dryness.   Eyes:  Negative for pain, itching and dryness.  Respiratory:  Negative for shortness of breath and difficulty breathing.   Cardiovascular:  Negative for chest pain and palpitations.  Gastrointestinal:  Negative for blood in stool, constipation and diarrhea.  Endocrine: Negative for increased urination.   Genitourinary:  Negative for difficulty urinating.  Musculoskeletal:  Positive for joint pain, joint pain, joint swelling and morning stiffness. Negative for myalgias, muscle tenderness and myalgias.  Skin:  Negative for color change, rash and redness.  Allergic/Immunologic: Negative for susceptible to infections.  Neurological:  Negative for dizziness, numbness, headaches, memory loss and weakness.  Hematological:  Negative for bruising/bleeding tendency.  Psychiatric/Behavioral:  Negative for confusion.    PMFS History:  Patient Active Problem List   Diagnosis Date Noted   Age-related osteoporosis without current pathological fracture 02/04/2017   Primary osteoarthritis of both feet 01/24/2017   Primary osteoarthritis of both knees 01/24/2017   ANA positive 08/04/2016   Routine general medical examination at a health care facility 12/18/2015   Primary osteoarthritis of both hands    Allergic rhinitis, cause unspecified     Past Medical History:  Diagnosis Date   Allergic rhinitis, cause unspecified    Allergy    Cataract    forming    Osteoarthritis    hands, great toes B - follows with rheum   Osteopenia 07/2009   DEXA 08/06/09: -1.9 fem    Family History  Problem Relation Age of Onset   Arthritis Father    Stroke Father 90   Arthritis Mother    Dementia Mother 82       mild-mod, in ALF, ambulatory   Macular degeneration Mother 32       "dry"   Stroke Paternal Grandmother    Stroke Maternal  Grandmother    Arthritis Sister    Arthritis Brother    Arthritis Sister    Colon cancer Neg Hx    Colon polyps Neg Hx    Esophageal cancer Neg Hx    Rectal cancer Neg Hx    Stomach cancer Neg Hx    Past Surgical History:  Procedure Laterality Date   COLONOSCOPY  06/12/2009   uretheral cyst removed  1981   Social History   Social History Narrative   married - lives with spouse   Retired Tourist information centre manager   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 -[employer]   Immunization History   Administered Date(s) Administered   Influenza Split 07/09/2013   Influenza,inj,Quad PF,6+ Mos 07/12/2017   Influenza-Unspecified 08/10/2015, 08/10/2018, 07/10/2020   Moderna SARS-COV2 Booster Vaccination 08/13/2020, 03/10/2021   Moderna Sars-Covid-2 Vaccination 11/04/2019, 12/02/2019   Pneumococcal Conjugate-13 09/18/2020   Pneumococcal Polysaccharide-23 10/10/2009   Tdap 01/16/2012   Zoster Recombinat (Shingrix) 08/04/2017, 10/20/2017   Zoster, Live 12/18/2015     Objective: Vital Signs: BP 116/73 (BP Location: Left Arm, Patient Position: Sitting, Cuff Size: Normal)   Pulse (!) 59   Ht 5' 5.25" (1.657 m)   Wt 131 lb (59.4 kg)   BMI 21.63 kg/m    Physical Exam Vitals and nursing note reviewed.  Constitutional:      Appearance: She is well-developed.  HENT:     Head: Normocephalic and atraumatic.  Eyes:     Conjunctiva/sclera: Conjunctivae normal.  Pulmonary:     Effort: Pulmonary effort is normal.  Abdominal:     Palpations: Abdomen is soft.  Musculoskeletal:     Cervical back: Normal range of motion.  Skin:    General: Skin is warm and dry.     Capillary Refill: Capillary refill takes less than 2 seconds.  Neurological:     Mental Status: She is alert and oriented to person, place, and time.  Psychiatric:        Behavior: Behavior normal.     Musculoskeletal Exam: C-spine, thoracic spine, lumbar spine have good range of motion with no discomfort.  Shoulder joints, elbow joints, wrist joints, MCPs, PIPs, DIPs have good range of motion with no synovitis.  Severe PIP and DIP thickening consistent with osteoarthritis of both hands.  Some inflammation in the right second PIP joint noted.  Hip joints have good range of motion with no discomfort.  Knee joints have good range of motion with no warmth or effusion.  Ankle joints have good range of motion with no tenderness or joint swelling.  CDAI Exam: CDAI Score: -- Patient Global: --; Provider Global: -- Swollen: --;  Tender: -- Joint Exam 06/23/2021   No joint exam has been documented for this visit   There is currently no information documented on the homunculus. Go to the Rheumatology activity and complete the homunculus joint exam.  Investigation: No additional findings.  Imaging: No results found.  Recent Labs: Lab Results  Component Value Date   WBC 4.5 09/18/2020   HGB 13.1 09/18/2020   PLT 270.0 09/18/2020   NA 136 09/18/2020   K 4.1 09/18/2020   CL 100 09/18/2020   CO2 28 09/18/2020   GLUCOSE 88 09/18/2020   BUN 15 09/18/2020   CREATININE 0.86 09/18/2020   BILITOT 0.8 09/18/2020   ALKPHOS 53 09/18/2020   AST 24 09/18/2020   ALT 14 09/18/2020   PROT 7.8 09/18/2020   ALBUMIN 4.5 09/18/2020   CALCIUM 9.2 09/18/2020   GFRAA 107 05/27/2020  Speciality Comments: Reclsat 05/21/19, 06/10/20  Procedures:  No procedures performed Allergies: Demerol, Meperidine, and Penicillins      Assessment / Plan:     Visit Diagnoses: Age-related osteoporosis without current pathological fracture - DEXA 01/11/2017 BMD 0.482 T score -3.3 left femoral neck.   DEXA updated on 06/03/2021: Left femoral neck BMD 0.516 with T score -3.1.  Statistically significant increase in BMD of the left total hip and lumbar spine.  DEXA results were discussed with the patient today in detail and all questions were addressed.  She has had two Reclast infusions (August 2020 and September 2021).  Discussed that since she remains within the osteoporosis range we recommend proceeding with 2 more Reclast infusions prior to her next DEXA in 2 years.  At which time we will likely have to discuss other treatment options versus drug holiday.  She is in agreement.  She is been taking vitamin D 2000 units daily and has been consuming calcium through her diet.  She was given a handout of dietary calcium options to review.  She has not had any recent falls or fractures.  She performs 210 minutes of cardio per week, weightlifting 2-3  times per week, and 1 yoga class per week.  We discussed the importance of resistive exercises. Discussed the risk for osteonecrosis of the jaw while on Reclast.  She has seen an endodontist in the past and is considering proceeding with apicoectomy soon.  She plans to call her endodontist today to discuss healing time prior to scheduling the procedure and her next reclast infusion.  We discussed that she will require clearance by her endodontist prior to scheduling her next Reclast infusion.  She will also require   updated lab work at that time.  Future orders were placed today.  She will keep [place] updated on the status of the procedure.  She will follow up in 6 months.   - Plan: CBC with Differential/Platelet, COMPLETE METABOLIC PANEL WITH GFR, VITAMIN D 25 Hydroxy (Vit-D Deficiency, Fractures)  Vitamin D deficiency -Vitamin D was 30 on 05/27/2020.  Future order for vitamin D was placed today.  She is recommended to do 2000 units daily.  CBC and CMP within normal limits on 09/18/2020.Plan: VITAMIN D 25 Hydroxy (Vit-D Deficiency, Fractures)  Primary osteoarthritis of both hands: She has good PIP and DIP thickening consistent with osteoarthritis of both hands.  She has some inflammation of the right second PIP joint.  Discussed the importance of joint protection and muscle strengthening.  She continues to take ginger, tart cherry, and turmeric for the natural anti-inflammatory properties.    Primary osteoarthritis of both knees: She has good range of motion of both knee joints with no warmth or effusion.  She remains active exercising on a daily basis.  Primary osteoarthritis of both feet: She has PIP and DIP thickening consistent with osteoarthritis of both feet.  Thickening of her bilateral first MTP joints.  ANA positive - 1:160, ENA -, C3 and C4 normal.  She has no clinical features of autoimmune disease.  Enostosis of femur    Orders: Orders Placed This Encounter  Procedures   CBC with  Differential/Platelet   COMPLETE METABOLIC PANEL WITH GFR   VITAMIN D 25 Hydroxy (Vit-D Deficiency, Fractures)   No orders of the defined types were placed in this encounter.    Follow-Up Instructions: Return in about 6 months (around 12/21/2021) for Osteoporosis, Osteoarthritis.   Ofilia Neas, PA-C  Note - This record has been created  using Editor, commissioning.  Chart creation errors have been sought, but may not always  have been located. Such creation errors do not reflect on  the standard of medical care.

## 2021-06-23 ENCOUNTER — Ambulatory Visit: Payer: Medicare PPO | Admitting: Physician Assistant

## 2021-06-23 ENCOUNTER — Other Ambulatory Visit: Payer: Self-pay

## 2021-06-23 ENCOUNTER — Encounter: Payer: Self-pay | Admitting: Physician Assistant

## 2021-06-23 VITALS — BP 116/73 | HR 59 | Ht 65.25 in | Wt 131.0 lb

## 2021-06-23 DIAGNOSIS — M810 Age-related osteoporosis without current pathological fracture: Secondary | ICD-10-CM | POA: Diagnosis not present

## 2021-06-23 DIAGNOSIS — M19071 Primary osteoarthritis, right ankle and foot: Secondary | ICD-10-CM | POA: Diagnosis not present

## 2021-06-23 DIAGNOSIS — M19072 Primary osteoarthritis, left ankle and foot: Secondary | ICD-10-CM | POA: Diagnosis not present

## 2021-06-23 DIAGNOSIS — M898X5 Other specified disorders of bone, thigh: Secondary | ICD-10-CM | POA: Diagnosis not present

## 2021-06-23 DIAGNOSIS — M170 Bilateral primary osteoarthritis of knee: Secondary | ICD-10-CM

## 2021-06-23 DIAGNOSIS — M19041 Primary osteoarthritis, right hand: Secondary | ICD-10-CM

## 2021-06-23 DIAGNOSIS — R768 Other specified abnormal immunological findings in serum: Secondary | ICD-10-CM | POA: Diagnosis not present

## 2021-06-23 DIAGNOSIS — E559 Vitamin D deficiency, unspecified: Secondary | ICD-10-CM | POA: Diagnosis not present

## 2021-06-23 DIAGNOSIS — M19042 Primary osteoarthritis, left hand: Secondary | ICD-10-CM

## 2021-06-23 NOTE — Patient Instructions (Signed)
Standing Labs We placed an order today for your standing lab work.   Please have your standing labs drawn in 1-2 months   If possible, please have your labs drawn 2 weeks prior to your appointment so that the provider can discuss your results at your appointment.  Please note that you may see your imaging and lab results in [facility] before we have reviewed them. We may be awaiting multiple results to interpret others before contacting you. Please allow our office up to 72 hours to thoroughly review all of the results before contacting the office for clarification of your results.  We have open lab daily: Monday through Thursday from 1:30-4:30 PM and Friday from 1:30-4:00 PM at the office of Dr. Bo Merino, [facility].   Please be advised, all patients with office appointments requiring lab work will take precedent over walk-in lab work.  If possible, please come for your lab work on Monday and Friday afternoons, as you may experience shorter wait times. The office is located at [address] [place], [place], [place] [address] No appointment is necessary.   Labs are drawn by [facility]. Please bring your co-pay at the time of your lab draw.  You may receive a bill from [facility] for your lab work.  If you wish to have your labs drawn at another location, please call the office 24 hours in advance to send orders.  If you have any questions regarding directions or hours of operation,  please call [phone].   As a reminder, please drink plenty of water prior to coming for your lab work. Thanks!

## 2021-07-07 ENCOUNTER — Other Ambulatory Visit: Payer: Self-pay | Admitting: *Deleted

## 2021-07-07 DIAGNOSIS — M810 Age-related osteoporosis without current pathological fracture: Secondary | ICD-10-CM

## 2021-07-07 DIAGNOSIS — E559 Vitamin D deficiency, unspecified: Secondary | ICD-10-CM

## 2021-07-08 ENCOUNTER — Other Ambulatory Visit: Payer: Self-pay | Admitting: *Deleted

## 2021-07-08 ENCOUNTER — Telehealth: Payer: Self-pay

## 2021-07-08 DIAGNOSIS — E559 Vitamin D deficiency, unspecified: Secondary | ICD-10-CM

## 2021-07-08 LAB — COMPLETE METABOLIC PANEL WITH GFR
AG Ratio: 1.6 (calc) (ref 1.0–2.5)
ALT: 11 U/L (ref 6–29)
AST: 17 U/L (ref 10–35)
Albumin: 4.3 g/dL (ref 3.6–5.1)
Alkaline phosphatase (APISO): 60 U/L (ref 37–153)
BUN: 17 mg/dL (ref 7–25)
CO2: 29 mmol/L (ref 20–32)
Calcium: 9.2 mg/dL (ref 8.6–10.4)
Chloride: 104 mmol/L (ref 98–110)
Creat: 0.76 mg/dL (ref 0.50–1.05)
Globulin: 2.7 g/dL (calc) (ref 1.9–3.7)
Glucose, Bld: 90 mg/dL (ref 65–99)
Potassium: 4.5 mmol/L (ref 3.5–5.3)
Sodium: 140 mmol/L (ref 135–146)
Total Bilirubin: 0.4 mg/dL (ref 0.2–1.2)
Total Protein: 7 g/dL (ref 6.1–8.1)
eGFR: 86 mL/min/{1.73_m2} (ref >=60)

## 2021-07-08 LAB — CBC WITH DIFFERENTIAL/PLATELET
Absolute Monocytes: 470 cells/uL (ref 200–950)
Basophils Absolute: 50 cells/uL (ref 0–200)
Basophils Relative: 0.9 %
Eosinophils Absolute: 168 cells/uL (ref 15–500)
Eosinophils Relative: 3 %
HCT: 37.2 % (ref 35.0–45.0)
Hemoglobin: 12.3 g/dL (ref 11.7–15.5)
Lymphs Abs: 1921 cells/uL (ref 850–3900)
MCH: 29.9 pg (ref 27.0–33.0)
MCHC: 33.1 g/dL (ref 32.0–36.0)
MCV: 90.5 fL (ref 80.0–100.0)
MPV: 9.6 fL (ref 7.5–12.5)
Monocytes Relative: 8.4 %
Neutro Abs: 2990 cells/uL (ref 1500–7800)
Neutrophils Relative %: 53.4 %
Platelets: 275 10*3/uL (ref 140–400)
RBC: 4.11 10*6/uL (ref 3.80–5.10)
RDW: 12.5 % (ref 11.0–15.0)
Total Lymphocyte: 34.3 %
WBC: 5.6 10*3/uL (ref 3.8–10.8)

## 2021-07-08 LAB — VITAMIN D 25 HYDROXY (VIT D DEFICIENCY, FRACTURES): Vit D, 25-Hydroxy: 26 ng/mL — ABNORMAL LOW (ref 30–100)

## 2021-07-08 MED ORDER — VITAMIN D (ERGOCALCIFEROL) 1.25 MG (50000 UNIT) PO CAPS: 50000.0000 [IU] | ORAL_CAPSULE | ORAL | 0 refills | Status: DC

## 2021-07-08 NOTE — Telephone Encounter (Signed)
Patient left a voicemail stating she was returning a call regarding her labwork results.

## 2021-07-08 NOTE — Progress Notes (Signed)
CBC and CMP WNL.  Vitamin D is low-26.  Please notify the patient and send in vitamin D 50,000 units once weekly x3 months.  Recheck vitamin D in 3 months.

## 2021-07-08 NOTE — Telephone Encounter (Signed)
See lab note for details.  

## 2021-07-16 ENCOUNTER — Telehealth: Payer: Self-pay

## 2021-07-16 NOTE — Telephone Encounter (Signed)
Attempted to contact patient and left message for patient to call the office.  

## 2021-07-16 NOTE — Telephone Encounter (Signed)
Advised patient that Ok to schedule reclast infusion.  Vitamin D level has to be above 20 to proceed with reclast infusion.  Her vitamin D was 26.  Please advise her to continue taking the supplement as prescribed.  Patient verbalized understanding.

## 2021-07-16 NOTE — Telephone Encounter (Signed)
Patient requested a return call to let her know if she needs to wait until she finishes her prescription of Vitamin D before she schedules her Reclast infusion.  Patient states the office can leave a voicemail on her cell phone.  [phone]

## 2021-07-28 ENCOUNTER — Other Ambulatory Visit: Payer: Self-pay | Admitting: Pharmacist

## 2021-07-28 DIAGNOSIS — M810 Age-related osteoporosis without current pathological fracture: Secondary | ICD-10-CM

## 2021-07-28 NOTE — Progress Notes (Signed)
Next infusion scheduled for Reclast IV on 07/29/21 and due for updated orders. Diagnosis:  Dose: 5mg  IV every 12 months  Last Clinic Visit: 06/23/21 Next Clinic Visit: 12/21/21  Last infusion: 06/10/20  Labs: 07/07/21 - CBC and CMP and Vitamin D wnl to proceed with Reclast  Orders placed for Reclast IV x 1 dose along with premedication of acetaminophen and diphenhydramine to be administered 30 minutes before medication infusion.  Knox Saliva, PharmD, MPH, BCPS Clinical Pharmacist (Rheumatology and Pulmonology)

## 2021-07-28 NOTE — Progress Notes (Signed)
Error

## 2021-07-29 ENCOUNTER — Ambulatory Visit (HOSPITAL_COMMUNITY)
Admission: RE | Admit: 2021-07-29 | Discharge: 2021-07-29 | Disposition: A | Discharge status: Home / Self Care | Payer: Medicare PPO | Source: Ambulatory Visit | Attending: Rheumatology | Admitting: Rheumatology

## 2021-07-29 DIAGNOSIS — M810 Age-related osteoporosis without current pathological fracture: Secondary | ICD-10-CM

## 2021-07-29 MED ORDER — ACETAMINOPHEN 325 MG PO TABS: 650.0000 mg | ORAL_TABLET | Freq: Once | ORAL | Status: DC

## 2021-07-29 MED ORDER — DIPHENHYDRAMINE HCL 25 MG PO CAPS
ORAL_CAPSULE | ORAL | Status: AC
  Filled 2021-07-29: qty 1

## 2021-07-29 MED ORDER — DIPHENHYDRAMINE HCL 25 MG PO CAPS: 25.0000 mg | ORAL_CAPSULE | Freq: Once | ORAL | Status: DC

## 2021-07-29 MED ORDER — ZOLEDRONIC ACID 5 MG/100ML IV SOLN
5.0000 mg | Freq: Once | INTRAVENOUS | Status: AC
  Administered 2021-07-29: 5 mg via INTRAVENOUS

## 2021-07-29 MED ORDER — ZOLEDRONIC ACID 5 MG/100ML IV SOLN
INTRAVENOUS | Status: AC
  Filled 2021-07-29: qty 100

## 2021-07-29 MED ORDER — ACETAMINOPHEN 325 MG PO TABS
ORAL_TABLET | ORAL | Status: AC
  Filled 2021-07-29: qty 2

## 2021-10-08 ENCOUNTER — Other Ambulatory Visit: Payer: Self-pay | Admitting: *Deleted

## 2021-10-08 DIAGNOSIS — E559 Vitamin D deficiency, unspecified: Secondary | ICD-10-CM | POA: Diagnosis not present

## 2021-10-08 DIAGNOSIS — M810 Age-related osteoporosis without current pathological fracture: Secondary | ICD-10-CM | POA: Diagnosis not present

## 2021-10-09 ENCOUNTER — Other Ambulatory Visit: Payer: Self-pay | Admitting: Rheumatology

## 2021-10-09 DIAGNOSIS — E559 Vitamin D deficiency, unspecified: Secondary | ICD-10-CM

## 2021-10-09 LAB — VITAMIN D 25 HYDROXY (VIT D DEFICIENCY, FRACTURES): Vit D, 25-Hydroxy: 48 ng/mL (ref 30–100)

## 2021-10-11 NOTE — Progress Notes (Signed)
Vitamin D is normal now.  Patient should continue to take over-the-counter vitamin D 2000 units p.o. daily.

## 2021-10-21 ENCOUNTER — Telehealth: Payer: Self-pay | Admitting: Internal Medicine

## 2021-10-21 NOTE — Telephone Encounter (Signed)
N/A unable to leave a message for patient to call me back at [phone] to schedule Medicare Annual Wellness Visit   Last AWV  09/18/20  Please schedule at anytime with [facility] if patient calls the office back.    40 Minutes appointment   Any questions, please call me at [phone]

## 2021-10-28 ENCOUNTER — Other Ambulatory Visit (HOSPITAL_BASED_OUTPATIENT_CLINIC_OR_DEPARTMENT_OTHER): Payer: Self-pay

## 2021-12-08 NOTE — Progress Notes (Unsigned)
Office Visit Note  Patient: Brianna Gardner             Date of Birth: 1956/05/07           MRN: [id number]             PCP: Hoyt Koch, MD Referring: Hoyt Koch, * Visit Date: 12/21/2021 Occupation: @GUAROCC @  Subjective:  No chief complaint on file.   History of Present Illness: Brianna Gardner is a 66 y.o. female ***   Activities of Daily Living:  Patient reports morning stiffness for *** {minute/hour:19697}.   Patient {ACTIONS;DENIES/REPORTS:21021675::"Denies"} nocturnal pain.  Difficulty dressing/grooming: {ACTIONS;DENIES/REPORTS:21021675::"Denies"} Difficulty climbing stairs: {ACTIONS;DENIES/REPORTS:21021675::"Denies"} Difficulty getting out of chair: {ACTIONS;DENIES/REPORTS:21021675::"Denies"} Difficulty using hands for taps, buttons, cutlery, and/or writing: {ACTIONS;DENIES/REPORTS:21021675::"Denies"}  No Rheumatology ROS completed.   PMFS History:  Patient Active Problem List   Diagnosis Date Noted   Age-related osteoporosis without current pathological fracture 02/04/2017   Primary osteoarthritis of both feet 01/24/2017   Primary osteoarthritis of both knees 01/24/2017   ANA positive 08/04/2016   Routine general medical examination at a health care facility 12/18/2015   Primary osteoarthritis of both hands    Allergic rhinitis, cause unspecified     Past Medical History:  Diagnosis Date   Allergic rhinitis, cause unspecified    Allergy    Cataract    forming    Osteoarthritis    hands, great toes B - follows with rheum   Osteopenia 07/2009   DEXA 08/06/09: -1.9 fem    Family History  Problem Relation Age of Onset   Arthritis Father    Stroke Father 47   Arthritis Mother    Dementia Mother 52       mild-mod, in ALF, ambulatory   Macular degeneration Mother 45       "dry"   Stroke Paternal Grandmother    Stroke Maternal Grandmother    Arthritis Sister    Arthritis Brother    Arthritis Sister    Colon cancer Neg Hx     Colon polyps Neg Hx    Esophageal cancer Neg Hx    Rectal cancer Neg Hx    Stomach cancer Neg Hx    Past Surgical History:  Procedure Laterality Date   COLONOSCOPY  06/12/2009   uretheral cyst removed  1981   Social History   Social History Narrative   married - lives with spouse   Retired Tourist information centre manager   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   Physical Exam   Musculoskeletal Exam: ***  CDAI Exam: CDAI Score: -- Patient Global: --; Provider Global: -- Swollen: --; Tender: -- Joint Exam 12/21/2021   No joint exam has been documented for this visit   There is currently no information documented on the homunculus. Go to the Rheumatology activity and complete the homunculus joint exam.  Investigation: No additional findings.  Imaging: No results found.  Recent Labs: Lab Results  Component Value Date   WBC 5.6 07/07/2021   HGB 12.3 07/07/2021   PLT 275 07/07/2021  NA 140 07/07/2021   K 4.5 07/07/2021   CL 104 07/07/2021   CO2 29 07/07/2021   GLUCOSE 90 07/07/2021   BUN 17 07/07/2021   CREATININE 0.76 07/07/2021   BILITOT 0.4 07/07/2021   ALKPHOS 53 09/18/2020   AST 17 07/07/2021   ALT 11 07/07/2021   PROT 7.0 07/07/2021   ALBUMIN 4.5 09/18/2020   CALCIUM 9.2 07/07/2021   GFRAA 107 05/27/2020    Speciality Comments: Reclsat 05/21/19, 06/10/20  Procedures:  No procedures performed Allergies:  Demerol, Meperidine, and Penicillins   Assessment / Plan:     Visit Diagnoses: No diagnosis found.  Orders: No orders of the defined types were placed in this encounter.  No orders of the defined types were placed in this encounter.   Face-to-face time spent with patient was *** minutes. Greater than 50% of time was spent in counseling and coordination of care.  Follow-Up Instructions: No follow-ups on file.   Earnestine Mealing, CMA  Note - This record has been created using Editor, commissioning.  Chart creation errors have been sought, but may not always  have been located. Such creation errors do not reflect on  the standard of medical care.

## 2021-12-21 ENCOUNTER — Encounter: Payer: Self-pay | Admitting: Rheumatology

## 2021-12-21 ENCOUNTER — Ambulatory Visit: Payer: Medicare PPO | Admitting: Rheumatology

## 2021-12-21 ENCOUNTER — Other Ambulatory Visit: Payer: Self-pay

## 2021-12-21 VITALS — BP 102/59 | HR 71 | Ht 65.0 in | Wt 131.8 lb

## 2021-12-21 DIAGNOSIS — R768 Other specified abnormal immunological findings in serum: Secondary | ICD-10-CM | POA: Diagnosis not present

## 2021-12-21 DIAGNOSIS — E559 Vitamin D deficiency, unspecified: Secondary | ICD-10-CM

## 2021-12-21 DIAGNOSIS — Q6671 Congenital pes cavus, right foot: Secondary | ICD-10-CM | POA: Diagnosis not present

## 2021-12-21 DIAGNOSIS — M898X5 Other specified disorders of bone, thigh: Secondary | ICD-10-CM

## 2021-12-21 DIAGNOSIS — M19072 Primary osteoarthritis, left ankle and foot: Secondary | ICD-10-CM

## 2021-12-21 DIAGNOSIS — M19042 Primary osteoarthritis, left hand: Secondary | ICD-10-CM

## 2021-12-21 DIAGNOSIS — M810 Age-related osteoporosis without current pathological fracture: Secondary | ICD-10-CM

## 2021-12-21 DIAGNOSIS — M7672 Peroneal tendinitis, left leg: Secondary | ICD-10-CM | POA: Diagnosis not present

## 2021-12-21 DIAGNOSIS — M19071 Primary osteoarthritis, right ankle and foot: Secondary | ICD-10-CM

## 2021-12-21 DIAGNOSIS — M19041 Primary osteoarthritis, right hand: Secondary | ICD-10-CM | POA: Diagnosis not present

## 2021-12-21 DIAGNOSIS — M170 Bilateral primary osteoarthritis of knee: Secondary | ICD-10-CM | POA: Diagnosis not present

## 2021-12-21 DIAGNOSIS — Q6672 Congenital pes cavus, left foot: Secondary | ICD-10-CM

## 2022-01-06 DIAGNOSIS — D225 Melanocytic nevi of trunk: Secondary | ICD-10-CM | POA: Diagnosis not present

## 2022-01-06 DIAGNOSIS — L821 Other seborrheic keratosis: Secondary | ICD-10-CM | POA: Diagnosis not present

## 2022-01-06 DIAGNOSIS — L814 Other melanin hyperpigmentation: Secondary | ICD-10-CM | POA: Diagnosis not present

## 2022-01-06 DIAGNOSIS — D2239 Melanocytic nevi of other parts of face: Secondary | ICD-10-CM | POA: Diagnosis not present

## 2022-01-06 DIAGNOSIS — L820 Inflamed seborrheic keratosis: Secondary | ICD-10-CM | POA: Diagnosis not present

## 2022-01-24 ENCOUNTER — Ambulatory Visit (INDEPENDENT_AMBULATORY_CARE_PROVIDER_SITE_OTHER): Payer: Medicare PPO | Admitting: Internal Medicine

## 2022-01-24 ENCOUNTER — Encounter: Payer: Self-pay | Admitting: Internal Medicine

## 2022-01-24 VITALS — BP 102/70 | HR 61 | Resp 18 | Ht 65.0 in | Wt 130.6 lb

## 2022-01-24 DIAGNOSIS — Z23 Encounter for immunization: Secondary | ICD-10-CM | POA: Diagnosis not present

## 2022-01-24 DIAGNOSIS — M810 Age-related osteoporosis without current pathological fracture: Secondary | ICD-10-CM

## 2022-01-24 DIAGNOSIS — Z0000 Encounter for general adult medical examination without abnormal findings: Secondary | ICD-10-CM

## 2022-01-24 LAB — COMPREHENSIVE METABOLIC PANEL
ALT: 18 U/L (ref 0–35)
AST: 23 U/L (ref 0–37)
Albumin: 4.5 g/dL (ref 3.5–5.2)
Alkaline Phosphatase: 54 U/L (ref 39–117)
BUN: 12 mg/dL (ref 6–23)
CO2: 28 mEq/L (ref 19–32)
Calcium: 9.2 mg/dL (ref 8.4–10.5)
Chloride: 102 mEq/L (ref 96–112)
Creatinine, Ser: 0.79 mg/dL (ref 0.40–1.20)
GFR: 77.5 mL/min (ref >60.00)
Glucose, Bld: 92 mg/dL (ref 70–99)
Potassium: 3.8 mEq/L (ref 3.5–5.1)
Sodium: 137 mEq/L (ref 135–145)
Total Bilirubin: 0.8 mg/dL (ref 0.2–1.2)
Total Protein: 7.7 g/dL (ref 6.0–8.3)

## 2022-01-24 LAB — LIPID PANEL
Cholesterol: 237 mg/dL — ABNORMAL HIGH (ref 0–200)
HDL: 76.2 mg/dL (ref >39.00)
LDL Cholesterol: 142 mg/dL — ABNORMAL HIGH (ref 0–99)
NonHDL: 160.6
Total CHOL/HDL Ratio: 3
Triglycerides: 93 mg/dL (ref 0.0–149.0)
VLDL: 18.6 mg/dL (ref 0.0–40.0)

## 2022-01-24 LAB — CBC
HCT: 36.8 % (ref 36.0–46.0)
Hemoglobin: 12.8 g/dL (ref 12.0–15.0)
MCHC: 34.8 g/dL (ref 30.0–36.0)
MCV: 89 fl (ref 78.0–100.0)
Platelets: 250 10*3/uL (ref 150.0–400.0)
RBC: 4.14 Mil/uL (ref 3.87–5.11)
RDW: 13 % (ref 11.5–15.5)
WBC: 4.8 10*3/uL (ref 4.0–10.5)

## 2022-01-24 LAB — VITAMIN D 25 HYDROXY (VIT D DEFICIENCY, FRACTURES): VITD: 41.63 ng/mL (ref 30.00–100.00)

## 2022-01-24 NOTE — Assessment & Plan Note (Addendum)
Taking reclast and due DEXA 2024. Checking CMP and vitamin D for calcium and vitamin D levels.  ?

## 2022-01-24 NOTE — Progress Notes (Signed)
? ?Subjective:  ? ?Patient ID: Brianna Gardner, female    DOB: November 30, 1955, 66 y.o.   MRN: [id number] ? ?HPI ?Here for medicare wellness and physical, no new complaints. Please see A/P for status and treatment of chronic medical problems.  ? ?Diet: heart healthy ?Physical activity: sedentary ?Depression/mood screen: negative ?Hearing: intact to whispered voice ?Visual acuity: grossly normal with lens, performs annual eye exam  ?ADLs: capable ?Fall risk: none ?Home safety: good ?Cognitive evaluation: intact to orientation, naming, recall and repetition ?EOL planning: adv directives discussed, not in place ? ?[facility] Office Visit from 01/24/2022 in [facility] at [place]  ?PHQ-2 Total Score 0  ? ?  ?  ? ? ?  12/19/2018  ?  8:23 AM 09/18/2020  ?  8:11 AM 01/24/2022  ?  7:59 AM  ?Fall Risk  ?Falls in the past year? 0 0 0  ?Was there an injury with Fall?  0 0  ?Fall Risk Category Calculator  0 0  ?Fall Risk Category  Low Low  ?Patient Fall Risk Level Low fall risk Low fall risk   ? ? ?I have personally reviewed and have noted ?1. The patient's medical and social history - reviewed today no changes ?2. Their use of alcohol, tobacco or illicit drugs ?3. Their current medications and supplements ?4. The patient's functional ability including ADL's, fall risks, home safety risks and hearing or visual impairment. ?5. Diet and physical activities ?6. Evidence for depression or mood disorders ?7. Care team reviewed and updated ?8.  The patient is not on an opioid pain medication. ? ?Patient Care Team: ?Hoyt Koch, MD as PCP - General (Internal Medicine) ?Roque Cash., MD (Obstetrics and Gynecology) ?Bo Merino, MD (Rheumatology) ?Ladene Artist, MD (Gastroenterology) ?Marygrace Drought, MD (Ophthalmology) ?Yvone Neu, MD (Dermatology) ?Past Medical History:  ?Diagnosis Date  ? Allergic rhinitis, cause unspecified   ? Allergy   ? Cataract   ? forming   ? Osteoarthritis   ? hands, great  toes B - follows with rheum  ? Osteopenia 07/2009  ? DEXA 08/06/09: -1.9 fem  ? ?Past Surgical History:  ?Procedure Laterality Date  ? COLONOSCOPY  06/12/2009  ? uretheral cyst removed  1981  ? ?Family History  ?Problem Relation Age of Onset  ? Arthritis Father   ? Stroke Father 86  ? Arthritis Mother   ? Dementia Mother 54  ?     mild-mod, in ALF, ambulatory  ? Macular degeneration Mother 39  ?     "dry"  ? Stroke Paternal Grandmother   ? Stroke Maternal Grandmother   ? Arthritis Sister   ? Arthritis Brother   ? Arthritis Sister   ? Colon cancer Neg Hx   ? Colon polyps Neg Hx   ? Esophageal cancer Neg Hx   ? Rectal cancer Neg Hx   ? Stomach cancer Neg Hx   ? ?Review of Systems  ?Constitutional: Negative.   ?HENT: Negative.    ?Eyes: Negative.   ?Respiratory:  Negative for cough, chest tightness and shortness of breath.   ?Cardiovascular:  Negative for chest pain, palpitations and leg swelling.  ?Gastrointestinal:  Negative for abdominal distention, abdominal pain, constipation, diarrhea, nausea and vomiting.  ?Musculoskeletal: Negative.   ?Skin: Negative.   ?Neurological: Negative.   ?Psychiatric/Behavioral: Negative.    ? ?Objective:  ?Physical Exam ?Constitutional:   ?   Appearance: She is well-developed.  ?HENT:  ?   Head: Normocephalic and atraumatic.  ?Cardiovascular:  ?  Rate and Rhythm: Normal rate and regular rhythm.  ?Pulmonary:  ?   Effort: Pulmonary effort is normal. No respiratory distress.  ?   Breath sounds: Normal breath sounds. No wheezing or rales.  ?Abdominal:  ?   General: Bowel sounds are normal. There is no distension.  ?   Palpations: Abdomen is soft.  ?   Tenderness: There is no abdominal tenderness. There is no rebound.  ?Musculoskeletal:  ?   Cervical back: Normal range of motion.  ?Skin: ?   General: Skin is warm and dry.  ?Neurological:  ?   Mental Status: She is alert and oriented to person, place, and time.  ?   Coordination: Coordination normal.  ? ? ?Vitals:  ? 01/24/22 0757  ?BP:  102/70  ?Pulse: 61  ?Resp: 18  ?SpO2: 98%  ?Weight: 130 lb 9.6 oz (59.2 kg)  ?Height: '5\' 5"'$  (1.651 m)  ? ? ?This visit occurred during the SARS-CoV-2 public health emergency.  Safety protocols were in place, including screening questions prior to the visit, additional usage of staff PPE, and extensive cleaning of exam room while observing appropriate contact time as indicated for disinfecting solutions.  ? ?Assessment & Plan:  ?Prevnar 20 given at visit ?

## 2022-01-24 NOTE — Assessment & Plan Note (Signed)
Flu shot yearly. Covid-19 counseled. Pneumonia given prevnar 20. Shingrix complete. Tetanus due advised to get at pharmacy. Colonoscopy due 2031. Mammogram due 2024, pap smear aged out and dexa due 2024. Counseled about sun safety and mole surveillance. Counseled about the dangers of distracted driving. Given 10 year screening recommendations.  ? ?

## 2022-02-10 DIAGNOSIS — Z1231 Encounter for screening mammogram for malignant neoplasm of breast: Secondary | ICD-10-CM | POA: Diagnosis not present

## 2022-03-01 DIAGNOSIS — N905 Atrophy of vulva: Secondary | ICD-10-CM | POA: Diagnosis not present

## 2022-03-01 DIAGNOSIS — Z1151 Encounter for screening for human papillomavirus (HPV): Secondary | ICD-10-CM | POA: Diagnosis not present

## 2022-03-01 DIAGNOSIS — Z01419 Encounter for gynecological examination (general) (routine) without abnormal findings: Secondary | ICD-10-CM | POA: Diagnosis not present

## 2022-03-01 DIAGNOSIS — N952 Postmenopausal atrophic vaginitis: Secondary | ICD-10-CM | POA: Diagnosis not present

## 2022-03-01 DIAGNOSIS — Z124 Encounter for screening for malignant neoplasm of cervix: Secondary | ICD-10-CM | POA: Diagnosis not present

## 2022-03-18 ENCOUNTER — Other Ambulatory Visit (HOSPITAL_BASED_OUTPATIENT_CLINIC_OR_DEPARTMENT_OTHER): Payer: Self-pay

## 2022-05-04 ENCOUNTER — Encounter: Payer: Self-pay | Admitting: *Deleted

## 2022-06-17 DIAGNOSIS — R051 Acute cough: Secondary | ICD-10-CM | POA: Diagnosis not present

## 2022-06-17 DIAGNOSIS — R0981 Nasal congestion: Secondary | ICD-10-CM | POA: Diagnosis not present

## 2022-06-17 DIAGNOSIS — J019 Acute sinusitis, unspecified: Secondary | ICD-10-CM | POA: Diagnosis not present

## 2023-01-10 DIAGNOSIS — L820 Inflamed seborrheic keratosis: Secondary | ICD-10-CM | POA: Diagnosis not present

## 2023-01-10 DIAGNOSIS — D2239 Melanocytic nevi of other parts of face: Secondary | ICD-10-CM | POA: Diagnosis not present

## 2023-01-10 DIAGNOSIS — D225 Melanocytic nevi of trunk: Secondary | ICD-10-CM | POA: Diagnosis not present

## 2023-01-10 DIAGNOSIS — L814 Other melanin hyperpigmentation: Secondary | ICD-10-CM | POA: Diagnosis not present

## 2023-01-10 DIAGNOSIS — L821 Other seborrheic keratosis: Secondary | ICD-10-CM | POA: Diagnosis not present

## 2023-01-25 ENCOUNTER — Telehealth: Payer: Self-pay

## 2023-01-25 DIAGNOSIS — H52203 Unspecified astigmatism, bilateral: Secondary | ICD-10-CM | POA: Diagnosis not present

## 2023-01-25 DIAGNOSIS — H5202 Hypermetropia, left eye: Secondary | ICD-10-CM | POA: Diagnosis not present

## 2023-01-25 DIAGNOSIS — H524 Presbyopia: Secondary | ICD-10-CM | POA: Diagnosis not present

## 2023-01-25 DIAGNOSIS — H2513 Age-related nuclear cataract, bilateral: Secondary | ICD-10-CM | POA: Diagnosis not present

## 2023-01-25 DIAGNOSIS — H43813 Vitreous degeneration, bilateral: Secondary | ICD-10-CM | POA: Diagnosis not present

## 2023-01-25 DIAGNOSIS — H04123 Dry eye syndrome of bilateral lacrimal glands: Secondary | ICD-10-CM | POA: Diagnosis not present

## 2023-01-25 NOTE — Telephone Encounter (Signed)
Called patient to schedule Medicare Annual Wellness Visit (AWV). Unable to reach patient.  Last date of AWV: 01/24/22  Please schedule an appointment at any time on Annual Wellness Visit Schedule.

## 2023-02-01 ENCOUNTER — Encounter: Payer: Self-pay | Admitting: Internal Medicine

## 2023-02-01 ENCOUNTER — Ambulatory Visit (INDEPENDENT_AMBULATORY_CARE_PROVIDER_SITE_OTHER): Payer: Medicare PPO | Admitting: Internal Medicine

## 2023-02-01 VITALS — BP 116/84 | HR 59 | Temp 97.8°F | Ht 65.0 in | Wt 131.2 lb

## 2023-02-01 DIAGNOSIS — M810 Age-related osteoporosis without current pathological fracture: Secondary | ICD-10-CM | POA: Diagnosis not present

## 2023-02-01 DIAGNOSIS — R5383 Other fatigue: Secondary | ICD-10-CM

## 2023-02-01 DIAGNOSIS — E785 Hyperlipidemia, unspecified: Secondary | ICD-10-CM | POA: Diagnosis not present

## 2023-02-01 DIAGNOSIS — Z0000 Encounter for general adult medical examination without abnormal findings: Secondary | ICD-10-CM | POA: Diagnosis not present

## 2023-02-01 LAB — CBC
HCT: 38.4 % (ref 36.0–46.0)
Hemoglobin: 13.2 g/dL (ref 12.0–15.0)
MCHC: 34.5 g/dL (ref 30.0–36.0)
MCV: 89.8 fl (ref 78.0–100.0)
Platelets: 273 10*3/uL (ref 150.0–400.0)
RBC: 4.27 Mil/uL (ref 3.87–5.11)
RDW: 13.2 % (ref 11.5–15.5)
WBC: 4.8 10*3/uL (ref 4.0–10.5)

## 2023-02-01 LAB — COMPREHENSIVE METABOLIC PANEL
ALT: 14 U/L (ref 0–35)
AST: 23 U/L (ref 0–37)
Albumin: 4.6 g/dL (ref 3.5–5.2)
Alkaline Phosphatase: 57 U/L (ref 39–117)
BUN: 13 mg/dL (ref 6–23)
CO2: 28 mEq/L (ref 19–32)
Calcium: 9.4 mg/dL (ref 8.4–10.5)
Chloride: 101 mEq/L (ref 96–112)
Creatinine, Ser: 0.83 mg/dL (ref 0.40–1.20)
GFR: 72.52 mL/min (ref >60.00)
Glucose, Bld: 87 mg/dL (ref 70–99)
Potassium: 4 mEq/L (ref 3.5–5.1)
Sodium: 137 mEq/L (ref 135–145)
Total Bilirubin: 0.7 mg/dL (ref 0.2–1.2)
Total Protein: 7.9 g/dL (ref 6.0–8.3)

## 2023-02-01 LAB — LIPID PANEL
Cholesterol: 245 mg/dL — ABNORMAL HIGH (ref 0–200)
HDL: 82.8 mg/dL (ref >39.00)
LDL Cholesterol: 149 mg/dL — ABNORMAL HIGH (ref 0–99)
NonHDL: 162.13
Total CHOL/HDL Ratio: 3
Triglycerides: 68 mg/dL (ref 0.0–149.0)
VLDL: 13.6 mg/dL (ref 0.0–40.0)

## 2023-02-01 LAB — VITAMIN B12: Vitamin B-12: 421 pg/mL (ref 211–911)

## 2023-02-01 LAB — VITAMIN D 25 HYDROXY (VIT D DEFICIENCY, FRACTURES): VITD: 43.79 ng/mL (ref 30.00–100.00)

## 2023-02-01 NOTE — Progress Notes (Signed)
Subjective:   Patient ID: Brianna Gardner, female    DOB: Mar 06, 1956, 67 y.o.   MRN: [id number]  HPI Here for medicare wellness and physical, no new complaints. Please see A/P for status and treatment of chronic medical problems.   Diet: heart healthy Physical activity: active daily Depression/mood screen: negative Hearing: intact to whispered voice Visual acuity: grossly normal with lens, performs annual eye exam  ADLs: capable Fall risk: none Home safety: good Cognitive evaluation: intact to orientation, naming, recall and repetition EOL planning: adv directives discussed  Flowsheet Row Office Visit from [facility]/01/2023 in [facility] at [place]  PHQ-2 Total Score 0       Flowsheet Row Office Visit from 02/01/2023 in [facility] at [place]  PHQ-9 Total Score 0         12/19/2018    8:23 AM 09/18/2020    8:11 AM 01/24/2022    7:59 AM 02/01/2023    8:11 AM  Fall Risk  Falls in the past year? 0 0 0 0  Was there an injury with Fall?  0 0 0  Fall Risk Category Calculator  0 0 0  Fall Risk Category (Retired)  Low Low   (RETIRED) Patient Fall Risk Level Low fall risk Low fall risk    Fall risk Follow up    Falls evaluation completed    I have personally reviewed and have noted 1. The patient's medical and social history - reviewed today no changes 2. Their use of alcohol, tobacco or illicit drugs 3. Their current medications and supplements 4. The patient's functional ability including ADL's, fall risks, home safety risks and hearing or visual impairment. 5. Diet and physical activities 6. Evidence for depression or mood disorders 7. Care team reviewed and updated 8.  The patient is not on an opioid pain medication.  Patient Care Team: Myrlene Broker, MD as PCP - General (Internal Medicine) Ola Spurr., MD (Obstetrics and Gynecology) Pollyann Savoy, MD (Rheumatology) Meryl Dare, MD  (Gastroenterology) Janet Berlin, MD (Ophthalmology) Cherly Beach, MD (Dermatology) Past Medical History:  Diagnosis Date   Allergic rhinitis, cause unspecified    Allergy    Cataract    forming    Osteoarthritis    hands, great toes B - follows with rheum   Osteopenia 07/2009   DEXA 08/06/09: -1.9 fem   Past Surgical History:  Procedure Laterality Date   COLONOSCOPY  06/12/2009   uretheral cyst removed  1981   Family History  Problem Relation Age of Onset   Arthritis Father    Stroke Father 56   Arthritis Mother    Dementia Mother 54       mild-mod, in ALF, ambulatory   Macular degeneration Mother 42       "dry"   Stroke Paternal Grandmother    Stroke Maternal Grandmother    Arthritis Sister    Arthritis Brother    Arthritis Sister    Colon cancer Neg Hx    Colon polyps Neg Hx    Esophageal cancer Neg Hx    Rectal cancer Neg Hx    Stomach cancer Neg Hx     Review of Systems  Constitutional: Negative.   HENT: Negative.    Eyes: Negative.   Respiratory:  Negative for cough, chest tightness and shortness of breath.   Cardiovascular:  Negative for chest pain, palpitations and leg swelling.  Gastrointestinal:  Negative for abdominal distention, abdominal pain, constipation, diarrhea, nausea and  vomiting.  Musculoskeletal: Negative.   Skin: Negative.   Neurological: Negative.   Psychiatric/Behavioral: Negative.      Objective:  Physical Exam Constitutional:      Appearance: She is well-developed.  HENT:     Head: Normocephalic and atraumatic.  Cardiovascular:     Rate and Rhythm: Normal rate and regular rhythm.  Pulmonary:     Effort: Pulmonary effort is normal. No respiratory distress.     Breath sounds: Normal breath sounds. No wheezing or rales.  Abdominal:     General: Bowel sounds are normal. There is no distension.     Palpations: Abdomen is soft.     Tenderness: There is no abdominal tenderness. There is no rebound.  Musculoskeletal:      Cervical back: Normal range of motion.  Skin:    General: Skin is warm and dry.  Neurological:     Mental Status: She is alert and oriented to person, place, and time.     Coordination: Coordination normal.     Vitals:   02/01/23 0807  BP: 116/84  Pulse: (!) 59  Temp: 97.8 F (36.6 C)  TempSrc: Oral  SpO2: 99%  Weight: 131 lb 4 oz (59.5 kg)  Height: 5\' 5"  (1.651 m)  EKG: Rate 51, axis normal, interval normal, sinus brady, rbbb not new, no st or t wave changes, no significant change compared to prior 2014   Assessment & Plan:

## 2023-02-03 DIAGNOSIS — E785 Hyperlipidemia, unspecified: Secondary | ICD-10-CM | POA: Insufficient documentation

## 2023-02-03 NOTE — Assessment & Plan Note (Signed)
Checking lipid panel and adjust as needed. Taking omega FA otc currently.

## 2023-02-03 NOTE — Assessment & Plan Note (Signed)
Due for dexa later this year. Checking CMP and vitamin D today. Is off reclast about 2 years now.

## 2023-02-03 NOTE — Assessment & Plan Note (Signed)
Flu shot yearly. Pneumonia complete. Shingrix complete. Tetanus got at pharmacy. Colonoscopy due 2031. Mammogram due 2025, pap smear aged out and dexa due 2024. Counseled about sun safety and mole surveillance. Counseled about the dangers of distracted driving. Given 10 year screening recommendations.

## 2023-02-13 DIAGNOSIS — Z1231 Encounter for screening mammogram for malignant neoplasm of breast: Secondary | ICD-10-CM | POA: Diagnosis not present

## 2023-02-13 LAB — HM MAMMOGRAPHY

## 2023-05-27 ENCOUNTER — Encounter: Payer: Self-pay | Admitting: Internal Medicine

## 2023-05-29 MED ORDER — ESTRADIOL 0.1 MG/GM VA CREA: 1.0000 | TOPICAL_CREAM | VAGINAL | 11 refills | Status: DC

## 2024-01-11 DIAGNOSIS — L570 Actinic keratosis: Secondary | ICD-10-CM | POA: Diagnosis not present

## 2024-01-11 DIAGNOSIS — L820 Inflamed seborrheic keratosis: Secondary | ICD-10-CM | POA: Diagnosis not present

## 2024-01-11 DIAGNOSIS — D2239 Melanocytic nevi of other parts of face: Secondary | ICD-10-CM | POA: Diagnosis not present

## 2024-01-11 DIAGNOSIS — D225 Melanocytic nevi of trunk: Secondary | ICD-10-CM | POA: Diagnosis not present

## 2024-01-11 DIAGNOSIS — L814 Other melanin hyperpigmentation: Secondary | ICD-10-CM | POA: Diagnosis not present

## 2024-01-11 DIAGNOSIS — L821 Other seborrheic keratosis: Secondary | ICD-10-CM | POA: Diagnosis not present

## 2024-01-29 ENCOUNTER — Encounter: Payer: Medicare PPO | Admitting: Internal Medicine

## 2024-02-07 ENCOUNTER — Ambulatory Visit (INDEPENDENT_AMBULATORY_CARE_PROVIDER_SITE_OTHER): Admitting: Internal Medicine

## 2024-02-07 ENCOUNTER — Encounter: Payer: Self-pay | Admitting: Internal Medicine

## 2024-02-07 ENCOUNTER — Ambulatory Visit

## 2024-02-07 VITALS — BP 108/60 | HR 64 | Temp 98.1°F | Ht 65.0 in | Wt 132.0 lb

## 2024-02-07 VITALS — BP 108/60 | HR 64 | Ht 65.0 in | Wt 132.4 lb

## 2024-02-07 DIAGNOSIS — Z0000 Encounter for general adult medical examination without abnormal findings: Secondary | ICD-10-CM

## 2024-02-07 DIAGNOSIS — E782 Mixed hyperlipidemia: Secondary | ICD-10-CM

## 2024-02-07 DIAGNOSIS — M810 Age-related osteoporosis without current pathological fracture: Secondary | ICD-10-CM

## 2024-02-07 LAB — VITAMIN B12: Vitamin B-12: 427 pg/mL (ref 211–911)

## 2024-02-07 LAB — CBC
HCT: 38.3 % (ref 36.0–46.0)
Hemoglobin: 13.2 g/dL (ref 12.0–15.0)
MCHC: 34.3 g/dL (ref 30.0–36.0)
MCV: 90.5 fl (ref 78.0–100.0)
Platelets: 281 10*3/uL (ref 150.0–400.0)
RBC: 4.24 Mil/uL (ref 3.87–5.11)
RDW: 13.2 % (ref 11.5–15.5)
WBC: 5.4 10*3/uL (ref 4.0–10.5)

## 2024-02-07 LAB — COMPREHENSIVE METABOLIC PANEL WITH GFR
ALT: 16 U/L (ref 0–35)
AST: 26 U/L (ref 0–37)
Albumin: 4.6 g/dL (ref 3.5–5.2)
Alkaline Phosphatase: 63 U/L (ref 39–117)
BUN: 11 mg/dL (ref 6–23)
CO2: 29 meq/L (ref 19–32)
Calcium: 9.5 mg/dL (ref 8.4–10.5)
Chloride: 101 meq/L (ref 96–112)
Creatinine, Ser: 0.8 mg/dL (ref 0.40–1.20)
GFR: 75.26 mL/min (ref >60.00)
Glucose, Bld: 90 mg/dL (ref 70–99)
Potassium: 4.2 meq/L (ref 3.5–5.1)
Sodium: 137 meq/L (ref 135–145)
Total Bilirubin: 0.8 mg/dL (ref 0.2–1.2)
Total Protein: 7.8 g/dL (ref 6.0–8.3)

## 2024-02-07 LAB — LIPID PANEL
Cholesterol: 253 mg/dL — ABNORMAL HIGH (ref 0–200)
HDL: 84.1 mg/dL (ref >39.00)
LDL Cholesterol: 151 mg/dL — ABNORMAL HIGH (ref 0–99)
NonHDL: 168.69
Total CHOL/HDL Ratio: 3
Triglycerides: 86 mg/dL (ref 0.0–149.0)
VLDL: 17.2 mg/dL (ref 0.0–40.0)

## 2024-02-07 LAB — VITAMIN D 25 HYDROXY (VIT D DEFICIENCY, FRACTURES): VITD: 45.11 ng/mL (ref 30.00–100.00)

## 2024-02-07 MED ORDER — ESTRADIOL 0.1 MG/GM VA CREA: 1.0000 | TOPICAL_CREAM | VAGINAL | 11 refills | Status: AC

## 2024-02-07 NOTE — Patient Instructions (Signed)
 Brianna Gardner , Thank you for taking time to come for your Medicare Wellness Visit. I appreciate your ongoing commitment to your health goals. Please review the following plan we discussed and let me know if I can assist you in the future.   Referrals/Orders/Follow-Ups/Clinician Recommendations: Aim for 30 minutes of exercise or brisk walking, 6-8 glasses of water, and 5 servings of fruits and vegetables each day. Patient will obtain immunization report from [facility] showing received a Tdap and RSV vaccines - will get to PCP for update of records.  This is a list of the screening recommended for you and due dates:  Health Maintenance  Topic Date Due   DTaP/Tdap/Td vaccine (2 - Td or Tdap) 01/15/2022   COVID-19 Vaccine (5 - 2024-25 season) 06/11/2023   Flu Shot  05/10/2024   Medicare Annual Wellness Visit  02/06/2025   Mammogram  02/12/2025   Colon Cancer Screening  08/21/2030   Pneumonia Vaccine  Completed   DEXA scan (bone density measurement)  Completed   Hepatitis C Screening  Completed   Zoster (Shingles) Vaccine  Completed   HPV Vaccine  Aged Out   Meningitis B Vaccine  Aged Out    Advanced directives: (Provided) Advance directive discussed with you today. I have provided a copy for you to complete at home and have notarized. Once this is complete, please bring a copy in to our office so we can scan it into your chart.   Next Medicare Annual Wellness Visit scheduled for next year: Yes

## 2024-02-07 NOTE — Progress Notes (Signed)
   Subjective:   Patient ID: Brianna Gardner, female    DOB: [birth year], 68 y.o.   MRN: [id number]  HPI The patient is here for physical.  PMH, [facility], social history reviewed and updated  Review of Systems  Constitutional: Negative.   HENT: Negative.    Eyes: Negative.   Respiratory:  Negative for cough, chest tightness and shortness of breath.   Cardiovascular:  Negative for chest pain, palpitations and leg swelling.  Gastrointestinal:  Negative for abdominal distention, abdominal pain, constipation, diarrhea, nausea and vomiting.  Musculoskeletal: Negative.   Skin: Negative.   Neurological: Negative.   Psychiatric/Behavioral: Negative.      Objective:  Physical Exam Constitutional:      Appearance: She is well-developed.  HENT:     Head: Normocephalic and atraumatic.  Cardiovascular:     Rate and Rhythm: Normal rate and regular rhythm.  Pulmonary:     Effort: Pulmonary effort is normal. No respiratory distress.     Breath sounds: Normal breath sounds. No wheezing or rales.  Abdominal:     General: Bowel sounds are normal. There is no distension.     Palpations: Abdomen is soft.     Tenderness: There is no abdominal tenderness. There is no rebound.  Musculoskeletal:     Cervical back: Normal range of motion.  Skin:    General: Skin is warm and dry.  Neurological:     Mental Status: She is alert and oriented to person, place, and time.     Coordination: Coordination normal.     Vitals:   02/07/24 0905  BP: 108/60  Pulse: 64  Temp: 98.1 F (36.7 C)  TempSrc: Oral  SpO2: 97%  Weight: 132 lb (59.9 kg)  Height: 5\' 5"  (1.651 m)    Assessment & Plan:

## 2024-02-07 NOTE — Assessment & Plan Note (Signed)
 Due for dexa she will get soon. S/P reclast  treatment and on drug holiday.

## 2024-02-07 NOTE — Progress Notes (Signed)
 Subjective:   Brianna Gardner is a 68 y.o. who presents for a Medicare Wellness preventive visit.  Visit Complete: In person  Persons Participating in Visit: Patient.  AWV Questionnaire: No: Patient Medicare AWV questionnaire was not completed prior to this visit.  Cardiac Risk Factors include: advanced age (>28men, >78 women);dyslipidemia     Objective:    Today's Vitals   02/07/24 0815  BP: 108/60  Pulse: 64  SpO2: 97%  Weight: 132 lb 6.4 oz (60.1 kg)  Height: 5\' 5"  (1.651 m)   Body mass index is 22.03 kg/m.     02/07/2024    8:13 AM  Advanced Directives  Does Patient Have a Medical Advance Directive? No  Would patient like information on creating a medical advance directive? Yes (MAU/Ambulatory/Procedural Areas - Information given)    Current Medications (verified) Outpatient Encounter Medications as of 02/07/2024  Medication Sig   acetaminophen  (TYLENOL ) 500 MG tablet Take 500 mg by mouth every 6 (six) hours as needed.   ammonium lactate (LAC-HYDRIN) 12 % lotion APPLY 2 TIMES DAILY TO DRY SKIN   cholecalciferol (VITAMIN D ) 1000 UNITS tablet Take 2,000 Units by mouth daily.   estradiol  (ESTRACE ) 0.1 MG/GM vaginal cream Place 1 Applicatorful vaginally 2 (two) times a week.   Ginger, Zingiber officinalis, (GINGER ROOT) 550 MG CAPS Take 550 mg by mouth daily.   Misc Natural Products (TART CHERRY ADVANCED) CAPS Take by mouth daily.   Multiple Vitamins-Minerals (CENTRUM SILVER) tablet Take 1 tablet by mouth daily.   omega-3 fish oil (MAXEPA) 1000 MG CAPS capsule Take 1 capsule by mouth daily.    triamcinolone  cream (KENALOG ) 0.5 % APPLY 3 TIMES DAILY FOR 2-3 WEEKS AS NEEDED TO HANDS   Turmeric 500 MG CAPS Take by mouth daily.   No facility-administered encounter medications on file as of 02/07/2024.    Allergies (verified) Demerol, Meperidine, and Penicillins   History: Past Medical History:  Diagnosis Date   Allergic rhinitis, cause unspecified    Allergy     Cataract    forming    Osteoarthritis    hands, great toes B - follows with rheum   Osteopenia 07/2009   DEXA 08/06/09: -1.9 fem   Past Surgical History:  Procedure Laterality Date   COLONOSCOPY  06/12/2009   uretheral cyst removed  1981   Family History  Problem Relation Age of Onset   Arthritis Father    Stroke Father 54   Arthritis Mother    Dementia Mother 26       mild-mod, in ALF, ambulatory   Macular degeneration Mother 33       "dry"   Stroke Paternal Grandmother    Stroke Maternal Grandmother    Arthritis Sister    Arthritis Brother    Arthritis Sister    Colon cancer Neg Hx    Colon polyps Neg Hx    Esophageal cancer Neg Hx    Rectal cancer Neg Hx    Stomach cancer Neg Hx    Social History   Socioeconomic History   Marital status: Married    Spouse name: Not on file   Number of children: Not on file   Years of education: Not on file   Highest education level: Doctorate  Occupational History   Not on file  Tobacco Use   Smoking status: Never    Passive exposure: Past   Smokeless tobacco: Never  Vaping Use   Vaping status: Never Used  Substance and Sexual Activity   Alcohol  use: Yes    Alcohol/week: 1.0 standard drink of alcohol    Types: 1 Glasses of wine per week    Comment: occassionally   Drug use: No   Sexual activity: Not on file  Other Topics Concern   Not on file  Social History Narrative   married - lives with spouse   Retired Programmer, systems   Part time Marketing executive -[employer]   Social Drivers of Health   Financial Resource Strain: Low Risk  (02/07/2024)   Overall Financial Resource Strain (CARDIA)    Difficulty of Paying Living Expenses: Not hard at all  Food Insecurity: No Food Insecurity (02/07/2024)   Hunger Vital Sign    Worried About Running Out of Food in the Last Year: Never true    Ran Out of Food in the Last Year: Never true  Transportation Needs: No Transportation Needs (02/07/2024)   PRAPARE - Therapist, art (Medical): No    Lack of Transportation (Non-Medical): No  Physical Activity: Sufficiently Active (02/07/2024)   Exercise Vital Sign    Days of Exercise per Week: 5 days    Minutes of Exercise per Session: 60 min  Stress: No Stress Concern Present (02/07/2024)   [employer] of Occupational Health - Occupational Stress Questionnaire    Feeling of Stress : Not at all  Social Connections: Socially Integrated (02/07/2024)   Social Connection and Isolation Panel [NHANES]    Frequency of Communication with Friends and Family: More than three times a week    Frequency of Social Gatherings with Friends and Family: More than three times a week    Attends Religious Services: More than 4 times per year    Active Member of [employer] or Organizations: Yes    Attends Engineer, structural: More than 4 times per year    Marital Status: Married    Tobacco Counseling Counseling given: No    Clinical Intake:  Pre-visit preparation completed: Yes  Pain : No/denies pain     BMI - recorded: 22.03 Nutritional Risks: None Diabetes: No  No results found for: "HGBA1C"   How often do you need to have someone help you when you read instructions, pamphlets, or other written materials from your doctor or pharmacy?: 1 - Never  Interpreter Needed?: No  Information entered by :: Kandy Orris, CMA   Activities of Daily Living     02/07/2024    8:18 AM  In your present state of health, do you have any difficulty performing the following activities:  Hearing? 0  Vision? 0  Difficulty concentrating or making decisions? 0  Walking or climbing stairs? 0  Dressing or bathing? 0  Doing errands, shopping? 0  Preparing Food and eating ? N  Using the Toilet? N  In the past six months, have you accidently leaked urine? N  Do you have problems with loss of bowel control? N  Managing your Medications? N  Managing your Finances? N  Housekeeping or managing your  Housekeeping? N    Patient Care Team: Adelia Homestead, MD as PCP - General (Internal Medicine) Catarina Cline., MD (Obstetrics and Gynecology) Nicholas Bari, MD (Rheumatology) Asencion Blacksmith, MD (Inactive) (Gastroenterology) Rudine Cos, MD (Ophthalmology) Beulah Brunt, MD (Dermatology)  Indicate any recent Medical Services you may have received from other than [facility] providers in the past year (date may be approximate).     Assessment:   This is a routine wellness examination for Brianna Gardner.  Hearing/Vision  screen Hearing Screening - Comments:: Denies hearing difficulties   Vision Screening - Comments:: Wears rx glasses - up to date with routine eye exams with Dr Melven Stable. 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       Depression Screen     02/07/2024    8:21 AM 02/01/2023    8:11 AM 01/24/2022    7:59 AM 09/18/2020    8:12 AM 12/19/2018    8:23 AM 07/12/2017    8:04 AM  PHQ 2/9 Scores  PHQ - 2 Score 0 0 0 0 0 0  PHQ- 9 Score 0 0        Fall Risk     02/07/2024    8:18 AM 02/01/2023    8:11 AM 01/24/2022    7:59 AM 09/18/2020    8:11 AM 12/19/2018    8:23 AM  Fall Risk   Falls in the past year? 0 0 0 0 0  Number falls in past yr: 0 0 0 0   Injury with Fall? 0 0 0 0   Risk for fall due to : No Fall Risks      Follow up Falls prevention discussed;Falls evaluation completed Falls evaluation completed       MEDICARE RISK AT HOME:  Medicare Risk at Home Any stairs in or around the home?: No If so, are there any without handrails?: No Home free of loose throw rugs in walkways, pet beds, electrical cords, etc?: Yes Adequate lighting in your home to reduce risk of falls?: Yes Life alert?: No Use of a cane, walker or w/c?: No Grab bars in the bathroom?: No Shower chair or bench in shower?: No Elevated toilet seat  or a handicapped toilet?: Yes  TIMED UP AND GO:  Was the test performed?  No  Cognitive Function: 6CIT completed        02/07/2024    8:22 AM  6CIT Screen  What Year? 0 points  What month? 0 points  What time? 0 points  Count back from 20 0 points  Months in reverse 0 points  Repeat phrase 0 points  Total Score 0 points    Immunizations Immunization History  Administered Date(s) Administered   Influenza Inj Mdck Quad With Preservative 07/30/2019   Influenza Split 07/09/2013   Influenza,inj,Quad PF,6+ Mos 07/12/2017, 08/02/2018   Influenza-Unspecified 08/10/2015, 08/10/2018, 07/10/2020   Moderna SARS-COV2 Booster Vaccination 08/13/2020, 03/10/2021   Moderna Sars-Covid-2 Vaccination 11/04/2019, 12/02/2019   PNEUMOCOCCAL CONJUGATE-20 01/24/2022   Pneumococcal Conjugate-13 09/18/2020   Pneumococcal Polysaccharide-23 10/10/2009   Tdap 01/16/2012   Zoster Recombinant(Shingrix ) 08/04/2017, 10/20/2017   Zoster, Live 12/18/2015    Screening Tests Health Maintenance  Topic Date Due   DTaP/Tdap/Td (2 - Td or Tdap) 01/15/2022   COVID-19 Vaccine (5 - 2024-25 season) 06/11/2023   INFLUENZA VACCINE  05/10/2024   Medicare Annual Wellness (AWV)  02/06/2025   MAMMOGRAM  02/12/2025   Colonoscopy  08/21/2030   Pneumonia Vaccine 67+ Years old  Completed   DEXA SCAN  Completed   Hepatitis C Screening  Completed   Zoster Vaccines- Shingrix   Completed   HPV VACCINES  Aged Out   Meningococcal B Vaccine  Aged Out    Health Maintenance  Health Maintenance Due  Topic Date  Due   DTaP/Tdap/Td (2 - Td or Tdap) 01/15/2022   COVID-19 Vaccine (5 - 2024-25 season) 06/11/2023   Health Maintenance Items Addressed:02/07/2024  Pt stated received Tdap & RSV vaccines at local [facility] pharmacy and will obtain a report and bring to office to update records.    Additional Screening:  Vision Screening: Recommended annual ophthalmology exams for early detection of glaucoma and other disorders of  the eye.  Pt stated has an appt in 03/2024 w/Dr Tanner.  Dental Screening: Recommended annual dental exams for proper oral hygiene  Community Resource Referral / Chronic Care Management: CRR required this visit?  No   CCM required this visit?  No     Plan:     I have personally reviewed and noted the following in the patient's chart:   Medical and social history Use of alcohol, tobacco or illicit drugs  Current medications and supplements including opioid prescriptions. Patient is not currently taking opioid prescriptions. Functional ability and status Nutritional status Physical activity Advanced directives List of other physicians Hospitalizations, surgeries, and ER visits in previous 12 months Vitals Screenings to include cognitive, depression, and falls Referrals and appointments  In addition, I have reviewed and discussed with patient certain preventive protocols, quality metrics, and best practice recommendations. A written personalized care plan for preventive services as well as general preventive health recommendations were provided to patient.     Patria Bookbinder, CMA   02/07/2024   After Visit Summary: (In Person-Declined) Patient declined AVS at this time.  Notes: Nothing significant to report at this time.

## 2024-02-07 NOTE — Assessment & Plan Note (Signed)
Checking lipid panel and adjust as needed. Diet controlled.  

## 2024-02-07 NOTE — Assessment & Plan Note (Signed)
 Flu shot yearly. Pneumonia complete. Shingrix  complete. Tetanus up to date. Colonoscopy up to date. Mammogram up to date, pap smear up to date and dexa due. Counseled about sun safety and mole surveillance. Counseled about the dangers of distracted driving. Given 10 year screening recommendations.

## 2024-02-09 ENCOUNTER — Encounter: Payer: Self-pay | Admitting: Internal Medicine

## 2024-02-15 DIAGNOSIS — Z1231 Encounter for screening mammogram for malignant neoplasm of breast: Secondary | ICD-10-CM | POA: Diagnosis not present

## 2024-02-15 LAB — HM MAMMOGRAPHY

## 2025-02-10 ENCOUNTER — Encounter: Admitting: Internal Medicine

## 2025-02-10 ENCOUNTER — Ambulatory Visit
